# Patient Record
Sex: Male | Born: 1980 | Race: Black or African American | Hispanic: No | Smoking: Never smoker
Health system: Southern US, Community
[De-identification: ages and names within clinical notes are randomized; demographics above are authoritative.]

## PROBLEM LIST (undated history)

## (undated) HISTORY — PX: KNEE LIGAMENT RECONSTRUCTION: SHX1895

---

## 2015-02-09 ENCOUNTER — Encounter: Payer: Self-pay | Admitting: Family Medicine

## 2015-02-09 ENCOUNTER — Ambulatory Visit (INDEPENDENT_AMBULATORY_CARE_PROVIDER_SITE_OTHER): Payer: BLUE CROSS/BLUE SHIELD | Admitting: Family Medicine

## 2015-02-09 VITALS — BP 122/74 | HR 79 | Temp 98.1°F | Resp 18 | Ht 72.0 in | Wt 247.9 lb

## 2015-02-09 DIAGNOSIS — L84 Corns and callosities: Secondary | ICD-10-CM

## 2015-02-09 NOTE — Patient Instructions (Signed)
Corns and Calluses Corns are small areas of thickened skin that usually occur on the top, sides, or tip of a toe. They contain a cone-shaped core with a point that can press on a nerve below. This causes pain. Calluses are areas of thickened skin that usually develop on hands, fingers, palms, soles of the feet, and heels. These are areas that experience frequent friction or pressure. CAUSES  Corns are usually the result of rubbing (friction) or pressure from shoes that are too tight or do not fit properly. Calluses are caused by repeated friction and pressure on the affected areas. SYMPTOMS  A hard growth on the skin.  Pain or tenderness under the skin.  Sometimes, redness and swelling.  Increased discomfort while wearing tight-fitting shoes. DIAGNOSIS  Your caregiver can usually tell what the problem is by doing a physical exam. TREATMENT  Removing the cause of the friction or pressure is usually the only treatment needed. However, sometimes medicines can be used to help soften the hardened, thickened areas. These medicines include salicylic acid plasters and 12% ammonium lactate lotion. These medicines should only be used under the direction of your caregiver. HOME CARE INSTRUCTIONS   Try to remove pressure from the affected area.  You may wear donut-shaped corn pads to protect your skin.  You may use a pumice stone or nonmetallic nail file to gently reduce the thickness of a corn.  Wear properly fitted footwear.  If you have calluses on the hands, wear gloves during activities that cause friction.  If you have diabetes, you should regularly examine your feet. Tell your caregiver if you notice any problems with your feet. SEEK IMMEDIATE MEDICAL CARE IF:   You have increased pain, swelling, redness, or warmth in the affected area.  Your corn or callus starts to drain fluid or bleeds.  You are not getting better, even with treatment. Document Released: 04/14/2004 Document  Revised: 10/01/2011 Document Reviewed: 03/06/2011 Exit[employer] Patient Information 2015 [employer]. This information is not intended to replace advice given to you by your health care provider. Make sure you discuss any questions you have with your health care provider.  

## 2015-02-09 NOTE — Progress Notes (Signed)
Name: Jonathan Salas   MRN: [id number]    DOB: 1981-07-05   Date:02/09/2015       Progress Note  Subjective  Chief Complaint  Chief Complaint  Patient presents with  . Establish Care  . Callouses    right great toe, patient stated he has had it for about 4-5 months    HPI  Patient is new to practice and reports no significant medical issues. Today he would like to address his right foot 1st digit callus which is painful and irritating when wearing shoes. No bleeding. Has not tried OTC therapy.  History  Substance Use Topics  . Smoking status: Never Smoker   . Smokeless tobacco: Not on file  . Alcohol Use: No    No current outpatient prescriptions on file.  Past Surgical History  Procedure Laterality Date  . Knee ligament reconstruction Right     History reviewed. No pertinent family history.  No Known Allergies   Review of Systems  CONSTITUTIONAL: No significant weight changes, fever, chills, weakness or fatigue.  HEENT:  - Eyes: No visual changes.  - Ears: No auditory changes. No pain.  - Nose: No sneezing, congestion, runny nose. - Throat: No sore throat. No changes in swallowing. SKIN: No rash or itching.  CARDIOVASCULAR: No chest pain, chest pressure or chest discomfort. No palpitations or edema.  RESPIRATORY: No shortness of breath, cough or sputum.  GASTROINTESTINAL: No anorexia, nausea, vomiting. No changes in bowel habits. No abdominal pain or blood.  GENITOURINARY: No dysuria. No frequency. No discharge.  NEUROLOGICAL: No headache, dizziness, syncope, paralysis, ataxia, numbness or tingling in the extremities. No memory changes. No change in bowel or bladder control.  MUSCULOSKELETAL: No joint pain. No muscle pain. HEMATOLOGIC: No anemia, bleeding or bruising.  LYMPHATICS: No enlarged lymph nodes.  PSYCHIATRIC: No change in mood. No change in sleep pattern.  ENDOCRINOLOGIC: No reports of sweating, cold or heat intolerance. No polyuria or polydipsia.       Objective  BP 122/74 mmHg  Pulse 79  Temp(Src) 98.1 F (36.7 C) (Oral)  Resp 18  Ht 6' (1.829 m)  Wt 247 lb 14.4 oz (112.447 kg)  BMI 33.61 kg/m2  SpO2 98% Body mass index is 33.61 kg/(m^2).  Physical Exam  Constitutional: Patient appears well-developed and well-nourished. In no distress.  HEENT:  - Head: Normocephalic and atraumatic.  - Ears: Bilateral TMs gray, no erythema or effusion - Nose: Nasal mucosa moist - Mouth/Throat: Oropharynx is clear and moist. No tonsillar hypertrophy or erythema. No post nasal drainage.  - Eyes: Conjunctivae clear, EOM movements normal. PERRLA. No scleral icterus.  Neck: Normal range of motion. Neck supple. No JVD present. No thyromegaly present.  Cardiovascular: Normal rate, regular rhythm and normal heart sounds.  No murmur heard.  Pulmonary/Chest: Effort normal and breath sounds normal. No respiratory distress. Musculoskeletal: Normal range of motion bilateral UE and LE, no joint effusions. Peripheral vascular: Bilateral LE no edema. Neurological: CN II-XII grossly intact with no focal deficits. Alert and oriented to person, place, and time. Coordination, balance, strength, speech and gait are normal.  Skin: Skin is warm and dry. No rash noted. No erythema.   Painful thick callus on right foot medial aspect of 1st toe.  Psychiatric: Patient has a normal mood and affect. Behavior is normal in office today. Judgment and thought content normal in office today.   Assessment & Plan  1. Callus of foot  The patient complains of tenderness and irritation due to skin mass.  No fevers, chills, short of breath, headache, lethargy, change in mentation, chest pain or palpitations.   Duration of symptoms: years Indication for removal: irritation, tenderness Consent signed: YES  Procedure: Foot Callus Freezing Location: Right foot 1st digit medial aspect Equipment used: Liquid Nitroglycerine Spray Anesthesia: None Cleaned and prepped:  Alcohol swab  After consent signed, are of skin prepped with alcohol and area sprayed with Liquid Nitro solution. Instructed on proper care to allow for proper healing. F/U for nursing visit if needed.

## 2015-03-17 ENCOUNTER — Ambulatory Visit: Payer: BLUE CROSS/BLUE SHIELD | Admitting: Family Medicine

## 2015-04-19 ENCOUNTER — Ambulatory Visit: Payer: BLUE CROSS/BLUE SHIELD | Admitting: Family Medicine

## 2016-03-11 ENCOUNTER — Ambulatory Visit (INDEPENDENT_AMBULATORY_CARE_PROVIDER_SITE_OTHER): Payer: BLUE CROSS/BLUE SHIELD

## 2016-03-11 ENCOUNTER — Encounter (HOSPITAL_COMMUNITY): Payer: Self-pay | Admitting: Nurse Practitioner

## 2016-03-11 ENCOUNTER — Ambulatory Visit (HOSPITAL_COMMUNITY)
Admission: EM | Admit: 2016-03-11 | Discharge: 2016-03-11 | Disposition: A | Discharge status: Home / Self Care | Payer: BLUE CROSS/BLUE SHIELD | Attending: Internal Medicine | Admitting: Internal Medicine

## 2016-03-11 DIAGNOSIS — S92301A Fracture of unspecified metatarsal bone(s), right foot, initial encounter for closed fracture: Secondary | ICD-10-CM

## 2016-03-11 DIAGNOSIS — T1490XA Injury, unspecified, initial encounter: Secondary | ICD-10-CM

## 2016-03-11 MED ORDER — HYDROCODONE-ACETAMINOPHEN 10-325 MG PO TABS: 1.0000 | ORAL_TABLET | Freq: Four times a day (QID) | ORAL | 0 refills | Status: DC | PRN

## 2016-03-11 NOTE — Discharge Instructions (Signed)
REST ICE ELEVATE  FOLLOW UP WITH FOOT DOCTOR NO BASKETBALL

## 2016-03-11 NOTE — ED Provider Notes (Signed)
CSN: [id number]     Arrival date & time 03/11/16  1212 History   First MD Initiated Contact with Patient 03/11/16 1316     Chief Complaint  Patient presents with  . Ankle Pain   (Consider location/radiation/quality/duration/timing/severity/associated sxs/prior Treatment) HPI 35 year old male was playing basketball yesterday when he came down and rolled his foot and had immediate onset of pain in the right lateral foot. Home treatment has included elevation and cold compresses and Tylenol. Pain score is approximately 6 at this time. No associated symptoms. History reviewed. No pertinent past medical history. Past Surgical History:  Procedure Laterality Date  . KNEE LIGAMENT RECONSTRUCTION Right    History reviewed. No pertinent family history. Social History  Substance Use Topics  . Smoking status: Never Smoker  . Smokeless tobacco: Never Used  . Alcohol use No    Review of Systems  Denies: HEADACHE, NAUSEA, ABDOMINAL PAIN, CHEST PAIN, CONGESTION, DYSURIA, SHORTNESS OF BREATH  Allergies  Review of patient's allergies indicates no known allergies.  Home Medications   Prior to Admission medications   Medication Sig Start Date End Date Taking? Authorizing Provider  HYDROcodone-acetaminophen (NORCO) 10-325 MG tablet Take 1 tablet by mouth every 6 (six) hours as needed. 03/11/16   Tharon AquasFrank C Kennedy Brines, PA   Meds Ordered and Administered this Visit  Medications - No data to display  BP 107/76   Pulse 74   Temp 98.4 F (36.9 C) (Oral)   Resp 18   SpO2 100%  No data found.   Physical Exam NURSES NOTES AND VITAL SIGNS REVIEWED. CONSTITUTIONAL: Well developed, well nourished, no acute distress HEENT: normocephalic, atraumatic EYES: Conjunctiva normal NECK:normal ROM, supple, no adenopathy PULMONARY:No respiratory distress, normal effort ABDOMINAL: Soft, ND, NT BS+, No CVAT MUSCULOSKELETAL: Normal ROM of all extremities, right foot tenderness over the lateral aspect over the  fifth metatarsal. No visible or palpable deformity noted. No ankle tenderness. Proximal fibula tenderness. SKIN: warm and dry without rash PSYCHIATRIC: Mood and affect, behavior are normal  Urgent Care Course   Clinical Course    Procedures (including critical care time)  Labs Review Labs Reviewed - No data to display  Imaging Review Dg Foot Complete Right  Result Date: 03/11/2016 CLINICAL DATA:  Basketball injury with lateral pain. EXAM: RIGHT FOOT COMPLETE - 3+ VIEW COMPARISON:  None. FINDINGS: Avulsion fracture of the base of the fifth metatarsal. Otherwise normal exam. IMPRESSION: Avulsion fracture of the base of the fifth metatarsal. Electronically Signed   By: Paulina FusiMark  Shogry M.D.   On: 03/11/2016 14:05    X-ray results are discussed with patient and his wife prior to discharge. Visual Acuity Review  Right Eye Distance:   Left Eye Distance:   Bilateral Distance:    Right Eye Near:   Left Eye Near:    Bilateral Near:        35 year old male with a metatarsal fracture., Verdene Rioam Walker, for referral to podiatry, analgesia, work note provided MDM   1. Metatarsal fracture, right, closed, initial encounter   2. Injury     Patient is reassured that there are no issues that require transfer to higher level of care at this time or additional tests. Patient is advised to continue home symptomatic treatment. Patient is advised that if there are new or worsening symptoms to attend the emergency department, contact primary care provider, or return to UC. Instructions of care provided discharged home in stable condition.    THIS NOTE WAS GENERATED USING A VOICE RECOGNITION SOFTWARE PROGRAM. ALL  REASONABLE EFFORTS  WERE MADE TO PROOFREAD THIS DOCUMENT FOR ACCURACY.  I have verbally reviewed the discharge instructions with the patient. A printed AVS was given to the patient.  All questions were answered prior to discharge.      Tharon AquasFrank C Mana Haberl, PA 03/11/16 2051

## 2016-03-11 NOTE — ED Triage Notes (Signed)
Pt c/o R ankle pain and swelling onset while playing basketball yesterday. He has applied ace wrap, soaked in epsom salt at home with no relief. He Is ambulatory.

## 2016-03-12 ENCOUNTER — Ambulatory Visit (INDEPENDENT_AMBULATORY_CARE_PROVIDER_SITE_OTHER): Payer: BLUE CROSS/BLUE SHIELD | Admitting: Podiatry

## 2016-03-12 ENCOUNTER — Ambulatory Visit (INDEPENDENT_AMBULATORY_CARE_PROVIDER_SITE_OTHER): Payer: BLUE CROSS/BLUE SHIELD

## 2016-03-12 ENCOUNTER — Encounter: Payer: Self-pay | Admitting: Podiatry

## 2016-03-12 DIAGNOSIS — M79671 Pain in right foot: Secondary | ICD-10-CM

## 2016-03-12 DIAGNOSIS — S92301A Fracture of unspecified metatarsal bone(s), right foot, initial encounter for closed fracture: Secondary | ICD-10-CM

## 2016-03-12 NOTE — Progress Notes (Signed)
Subjective:     Patient ID: Jonathan Salas, male   DOB: 10/23/1980, 35 y.o.   MRN: [id number]  HPI patient states he broke his right foot over the weekend and he is worried that he still cannot walk on it   Review of Systems  All other systems reviewed and are negative.      Objective:   Physical Exam  Constitutional: He is oriented to person, place, and time.  Cardiovascular: Intact distal pulses.   Musculoskeletal: Normal range of motion.  Neurological: He is oriented to person, place, and time.  Skin: Skin is warm.  Nursing note and vitals reviewed.  neurovascular status intact muscle strength adequate range of motion within normal limits with patient found to have significant forefoot edema within the entire dorsal foot at extended to the lateral side of foot with exquisite discomfort around the base of the fifth metatarsal right. Patient cannot bear weight on the side of his foot due to the pain was found have good digital perfusion and is well oriented 3     Assessment:     Fracture base of fifth metatarsal right with comminuted element to it with swelling of the foot    Plan:     H&P and multiple view x-rays reviewed and today I applied Unna boot Ace wrap and instructed on limited on 4-5 days and less she should develop pain or discoloration of his nails or skin where he should take it off. I explained that this may need to be operated on eventually the fragments may need to be removed and the tendon tightened but at this point I would not do that and less symptoms persist and we will watch and see this patient again in about 2 weeks. Continue to use air fracture walking boot  X-ray report indicated that there is some fragments around the base of the fifth metatarsal right

## 2016-03-12 NOTE — Progress Notes (Signed)
   Subjective:    Patient ID: Jonathan Salas, male    DOB: 03/22/1981, 35 y.o.   MRN: [id number]  HPI  I broke my foot playing basketball on Saturday went to urgent care Sunday     Review of Systems  All other systems reviewed and are negative.      Objective:   Physical Exam        Assessment & Plan:

## 2016-03-16 ENCOUNTER — Telehealth: Payer: Self-pay | Admitting: *Deleted

## 2016-03-16 DIAGNOSIS — S92301A Fracture of unspecified metatarsal bone(s), right foot, initial encounter for closed fracture: Secondary | ICD-10-CM

## 2016-03-16 NOTE — Telephone Encounter (Addendum)
Pt left name and phone. Unable to leave a message mailbox is full. Pt came to office for paperwork for FMLA, front office staff helped.

## 2016-03-23 ENCOUNTER — Encounter: Payer: Self-pay | Admitting: Podiatry

## 2016-03-23 ENCOUNTER — Ambulatory Visit (INDEPENDENT_AMBULATORY_CARE_PROVIDER_SITE_OTHER): Payer: BLUE CROSS/BLUE SHIELD

## 2016-03-23 ENCOUNTER — Ambulatory Visit (INDEPENDENT_AMBULATORY_CARE_PROVIDER_SITE_OTHER): Payer: BLUE CROSS/BLUE SHIELD | Admitting: Podiatry

## 2016-03-23 DIAGNOSIS — M79671 Pain in right foot: Secondary | ICD-10-CM

## 2016-03-23 DIAGNOSIS — S92301A Fracture of unspecified metatarsal bone(s), right foot, initial encounter for closed fracture: Secondary | ICD-10-CM

## 2016-03-23 NOTE — Progress Notes (Signed)
Subjective:     Patient ID: Jonathan Salas, male   DOB: 06/24/1981, 35 y.o.   MRN: [id number]  HPI patient states the pain has improved but it still present and he still getting some swelling   Review of Systems     Objective:   Physical Exam Neurovascular status intact muscle strength adequate with patient noted to have discomfort in the lateral side of the right foot around the base of the fifth metatarsal with fluid buildup noted    Assessment:     What appears to be small fracture right which were hopeful for healing    Plan:     Dispensed a brace to start wearing and instructed on continued boot usage with gradual reduction over the next couple weeks. Reappoint 4 weeks or earlier if needed  X-rays indicate there still is a small fleck fracture on the lateral side of the right fifth metatarsal that is localized

## 2016-04-20 ENCOUNTER — Encounter: Payer: Self-pay | Admitting: Podiatry

## 2016-04-20 ENCOUNTER — Ambulatory Visit (INDEPENDENT_AMBULATORY_CARE_PROVIDER_SITE_OTHER): Payer: BLUE CROSS/BLUE SHIELD | Admitting: Podiatry

## 2016-04-20 ENCOUNTER — Ambulatory Visit (INDEPENDENT_AMBULATORY_CARE_PROVIDER_SITE_OTHER): Payer: BLUE CROSS/BLUE SHIELD

## 2016-04-20 DIAGNOSIS — S92301A Fracture of unspecified metatarsal bone(s), right foot, initial encounter for closed fracture: Secondary | ICD-10-CM | POA: Diagnosis not present

## 2016-04-20 DIAGNOSIS — M79671 Pain in right foot: Secondary | ICD-10-CM

## 2016-04-20 NOTE — Progress Notes (Signed)
Subjective:     Patient ID: Jonathan DowMarcus A Smay, male   DOB: 07/13/1981, 35 y.o.   MRN: [id number]  HPI patient presents stating it's improving but still mildly painful in the outside of the right foot   Review of Systems     Objective:   Physical Exam Neurovascular status intact with bone spur and fracture of the base of fifth metatarsal right that appears to be healing    Assessment:     Healing area base of fifth metatarsal from previous fracture    Plan:     X-ray taken reviewed and did explain that if this becomes chronic we may need to eventually excise bone  X-ray indicates he appears to be occurring but is not fully present

## 2016-04-24 ENCOUNTER — Telehealth: Payer: Self-pay | Admitting: *Deleted

## 2016-04-24 NOTE — Telephone Encounter (Addendum)
Pt left name and phone number. Pt states Dr. Charlsie Merlesegal released him to go back to work, and he would like to know what he can do to get a prescription for pain medication. 04/25/2016-Dr. Regal prescribed Vicodin 5/325mg  #30 one tablet every 6 hours prn foot.  I left message informing pt [facility] and reminded him he could not work on or drive while on this narcotic pain medication.

## 2016-04-25 MED ORDER — HYDROCODONE-ACETAMINOPHEN 5-325 MG PO TABS: 1.0000 | ORAL_TABLET | Freq: Four times a day (QID) | ORAL | 0 refills | Status: DC | PRN

## 2016-04-25 NOTE — Telephone Encounter (Signed)
vicodin 5/325  #30 

## 2016-07-14 ENCOUNTER — Encounter (HOSPITAL_COMMUNITY): Payer: Self-pay | Admitting: *Deleted

## 2016-07-14 ENCOUNTER — Ambulatory Visit (HOSPITAL_COMMUNITY)
Admission: EM | Admit: 2016-07-14 | Discharge: 2016-07-14 | Disposition: A | Discharge status: Home / Self Care | Payer: BLUE CROSS/BLUE SHIELD | Attending: Emergency Medicine | Admitting: Emergency Medicine

## 2016-07-14 DIAGNOSIS — R05 Cough: Secondary | ICD-10-CM | POA: Diagnosis present

## 2016-07-14 DIAGNOSIS — Z79899 Other long term (current) drug therapy: Secondary | ICD-10-CM | POA: Diagnosis not present

## 2016-07-14 DIAGNOSIS — J029 Acute pharyngitis, unspecified: Secondary | ICD-10-CM | POA: Diagnosis present

## 2016-07-14 DIAGNOSIS — J40 Bronchitis, not specified as acute or chronic: Secondary | ICD-10-CM | POA: Insufficient documentation

## 2016-07-14 LAB — POCT RAPID STREP A: STREPTOCOCCUS, GROUP A SCREEN (DIRECT): NEGATIVE

## 2016-07-14 MED ORDER — AMOXICILLIN 500 MG PO CAPS: 500.0000 mg | ORAL_CAPSULE | Freq: Three times a day (TID) | ORAL | 0 refills | Status: DC

## 2016-07-14 MED ORDER — PREDNISONE 50 MG PO TABS: ORAL_TABLET | ORAL | 0 refills | Status: DC

## 2016-07-14 NOTE — ED Triage Notes (Signed)
C/O nasal congestion, runny nose, sore throat, productive cough, feeling feverish x 2 days.  Has been taking Tyl and IBU ( last dose @ approx 0900).

## 2016-07-14 NOTE — ED Provider Notes (Signed)
[facility]    CSN: [id number] Arrival date & time: 07/14/16  1201     History   Chief Complaint Chief Complaint  Patient presents with  . Cough  . Sore Throat    HPI Fredna DowMarcus A Mcgreal is a 35 y.o. male.   HPI  He is a 35 year old man here for evaluation of cough and sore throat. Symptoms started 2 days ago with nasal congestion, rhinorrhea, sore throat, and cough. He also reports intermittent left-sided ear pressure. He reports subjective fevers and chills. He's been taking Tylenol and ibuprofen with good improvement. He does report occasional wheeze. Denies any shortness of breath. No nausea or vomiting.  History reviewed. No pertinent past medical history.  Patient Active Problem List   Diagnosis Date Noted  . Callus of foot 02/09/2015    Past Surgical History:  Procedure Laterality Date  . KNEE LIGAMENT RECONSTRUCTION Right        Home Medications    Prior to Admission medications   Medication Sig Start Date End Date Taking? Authorizing Provider  amoxicillin (AMOXIL) 500 MG capsule Take 1 capsule (500 mg total) by mouth 3 (three) times daily. 07/14/16   Charm RingsErin J Lindell Tussey, MD  predniSONE (DELTASONE) 50 MG tablet Take 1 pill daily for 5 days. 07/14/16   Charm RingsErin J Ramiro Pangilinan, MD    Family History History reviewed. No pertinent family history.  Social History Social History  Substance Use Topics  . Smoking status: Never Smoker  . Smokeless tobacco: Never Used  . Alcohol use No     Allergies   Patient has no known allergies.   Review of Systems Review of Systems As in history of present illness  Physical Exam Triage Vital Signs ED Triage Vitals  Enc Vitals Group     BP 07/14/16 1216 127/83     Pulse Rate 07/14/16 1216 101     Resp 07/14/16 1216 20     Temp 07/14/16 1216 98.5 F (36.9 C)     Temp Source 07/14/16 1216 Oral     SpO2 07/14/16 1216 100 %     Weight --      Height --      Head Circumference --      Peak Flow --      Pain Score  07/14/16 1217 7     Pain Loc --      Pain Edu? --      Excl. in GC? --    No data found.   Updated Vital Signs BP 127/83   Pulse 101   Temp 98.5 F (36.9 C) (Oral)   Resp 20   SpO2 100%   Visual Acuity Right Eye Distance:   Left Eye Distance:   Bilateral Distance:    Right Eye Near:   Left Eye Near:    Bilateral Near:     Physical Exam  Constitutional: He appears well-developed and well-nourished. No distress.  HENT:  Mouth/Throat: No oropharyngeal exudate.  TMs mildly retracted bilaterally. Nasal mucosa is erythematous and edematous with clear drainage present. Oropharynx with mild erythema.  Neck: Neck supple.  He does have a right postauricular lymph node.  Cardiovascular: Normal rate, regular rhythm and normal heart sounds.   No murmur heard. Pulmonary/Chest: Effort normal. No respiratory distress. He has no wheezes. He has no rales.  Diffusely coarse breath sounds  Lymphadenopathy:    He has no cervical adenopathy.     UC Treatments / Results  Labs (all labs ordered are listed, but  only abnormal results are displayed) Labs Reviewed  POCT RAPID STREP A    EKG  EKG Interpretation None       Radiology No results found.  Procedures Procedures (including critical care time)  Medications Ordered in UC Medications - No data to display   Initial Impression / Assessment and Plan / UC Course  I have reviewed the triage vital signs and the nursing notes.  Pertinent labs & imaging results that were available during my care of the patient were reviewed by me and considered in my medical decision making (see chart for details).  Clinical Course     Treatment for bronchitis with amoxicillin and prednisone. He states he has a nasal spray and inhaler left over from similar symptoms a year ago. Follow-up as needed.  Final Clinical Impressions(s) / UC Diagnoses   Final diagnoses:  Bronchitis    New Prescriptions Discharge Medication List as of  07/14/2016 12:31 PM    START taking these medications   Details  amoxicillin (AMOXIL) 500 MG capsule Take 1 capsule (500 mg total) by mouth 3 (three) times daily., Starting Sat 07/14/2016, Normal    predniSONE (DELTASONE) 50 MG tablet Take 1 pill daily for 5 days., Normal         Charm RingsErin J Aime Meloche, MD 07/14/16 1255

## 2016-07-14 NOTE — Discharge Instructions (Signed)
You have bronchitis. Take azithromycin and prednisone as prescribed. Use albuterol as needed for cough. Use the nasal spray to help with congestion/runny nose. You should see improvement in the next 3-5 days. If you develop fevers, difficulty breathing, or are just not getting better, please come back or go to the emergency room.

## 2016-07-17 LAB — CULTURE, GROUP A STREP (THRC)

## 2016-09-19 ENCOUNTER — Ambulatory Visit (HOSPITAL_COMMUNITY)
Admission: EM | Admit: 2016-09-19 | Discharge: 2016-09-19 | Disposition: A | Discharge status: Home / Self Care | Payer: BLUE CROSS/BLUE SHIELD | Attending: Family Medicine | Admitting: Family Medicine

## 2016-09-19 ENCOUNTER — Encounter (HOSPITAL_COMMUNITY): Payer: Self-pay | Admitting: *Deleted

## 2016-09-19 DIAGNOSIS — J069 Acute upper respiratory infection, unspecified: Secondary | ICD-10-CM | POA: Diagnosis not present

## 2016-09-19 DIAGNOSIS — B9789 Other viral agents as the cause of diseases classified elsewhere: Secondary | ICD-10-CM

## 2016-09-19 MED ORDER — BENZONATATE 100 MG PO CAPS: 100.0000 mg | ORAL_CAPSULE | Freq: Three times a day (TID) | ORAL | 0 refills | Status: DC

## 2016-09-19 NOTE — ED Provider Notes (Signed)
CSN: [id number]     Arrival date & time 09/19/16  1635 History   First MD Initiated Contact with Patient 09/19/16 1726     Chief Complaint  Patient presents with  . Headache   (Consider location/radiation/quality/duration/timing/severity/associated sxs/prior Treatment) 36 year old male presents to clinic with 3 day history of fever, headaches, body aches, muscle aches, congestion, and cough. He denies any abdominal pain, nausea, vomiting, or diarrhea. She further complains of fatigue, weakness, loss of appetite, however he has been drinking fluids. He does not smoke, has no history of lung disease such as asthma, emphysema, or COPD, works in a [employer] shop, and he denies having had the influenza vaccine this year.   The history is provided by the patient.  Headache    History reviewed. No pertinent past medical history. Past Surgical History:  Procedure Laterality Date  . KNEE LIGAMENT RECONSTRUCTION Right    History reviewed. No pertinent family history. Social History  Substance Use Topics  . Smoking status: Never Smoker  . Smokeless tobacco: Never Used  . Alcohol use No    Review of Systems  Reason unable to perform ROS: as covered in HPI.  Neurological: Positive for headaches.  All other systems reviewed and are negative.   Allergies  Patient has no known allergies.  Home Medications   Prior to Admission medications   Medication Sig Start Date End Date Taking? Authorizing Provider  benzonatate (TESSALON) 100 MG capsule Take 1 capsule (100 mg total) by mouth every 8 (eight) hours. 09/19/16   Dorena BodoLawrence Marieke Lubke, NP   Meds Ordered and Administered this Visit  Medications - No data to display  BP 138/86 (BP Location: Right Arm)   Pulse 107   Temp 101.5 F (38.6 C) (Oral)   Resp 20   SpO2 100%  No data found.   Physical Exam  Constitutional: He is oriented to person, place, and time. He appears well-developed and well-nourished. He appears ill. No distress.   HENT:  Head: Normocephalic and atraumatic.  Right Ear: Tympanic membrane and external ear normal.  Left Ear: Tympanic membrane and external ear normal.  Nose: Nose normal. Right sinus exhibits no maxillary sinus tenderness and no frontal sinus tenderness. Left sinus exhibits no maxillary sinus tenderness and no frontal sinus tenderness.  Mouth/Throat: Uvula is midline, oropharynx is clear and moist and mucous membranes are normal. No oropharyngeal exudate. Tonsils are 2+ on the right. Tonsils are 2+ on the left. No tonsillar exudate.  Eyes: Pupils are equal, round, and reactive to light.  Neck: Normal range of motion. Neck supple. No JVD present.  Cardiovascular: Normal rate and regular rhythm.   Pulmonary/Chest: Effort normal and breath sounds normal. No respiratory distress. He has no wheezes.  Abdominal: Soft. Bowel sounds are normal.  Lymphadenopathy:       Head (right side): No submental, no submandibular, no tonsillar and no preauricular adenopathy present.       Head (left side): No submental, no submandibular, no tonsillar and no preauricular adenopathy present.    He has no cervical adenopathy.  Neurological: He is alert and oriented to person, place, and time.  Skin: Skin is warm and dry. Capillary refill takes less than 2 seconds. No rash noted. He is not diaphoretic. No erythema.  Psychiatric: He has a normal mood and affect.  Nursing note and vitals reviewed.   Urgent Care Course     Procedures (including critical care time)  Labs Review Labs Reviewed - No data to display  Imaging Review  No results found.        MDM   1. Viral URI with cough    You most likely have a viral URI, I advise rest, plenty of fluids and management of symptoms with over the counter medicines. For symptoms you may take Tylenol as needed every 4-6 hours for body aches or fever, not to exceed 4,000 mg a day, Take mucinex or mucinex DM ever 12 hours with a full glass of water, you may use  an inhaled steroid such as Flonase, 2 sprays each nostril once a day for congestion, or an antihistamine such as Claritin or Zyrtec once a day. For cough, I have prescribed a medication called Tessalon. Take 1 tablet every 8 hours as needed for your cough. Should your symptoms worsen or fail to resolve, follow up with your primary care provider or return to clinic.       Dorena Bodo, NP 09/19/16 1754

## 2016-09-19 NOTE — Discharge Instructions (Signed)
You most likely have a viral URI, I advise rest, plenty of fluids and management of symptoms with over the counter medicines. For symptoms you may take Tylenol as needed every 4-6 hours for body aches or fever, not to exceed 4,000 mg a day, Take mucinex or mucinex DM ever 12 hours with a full glass of water, you may use an inhaled steroid such as Flonase, 2 sprays each nostril once a day for congestion, or an antihistamine such as Claritin or Zyrtec once a day. For cough, I have prescribed a medication called Tessalon. Take 1 tablet every 8 hours as needed for your cough. Should your symptoms worsen or fail to resolve, follow up with your primary care provider or return to clinic.  °

## 2016-09-19 NOTE — ED Triage Notes (Signed)
Headache   Cough   With   Blood  Tinged   Sputum           Fever     With  Body  Aches       Symptoms      Symptoms  X   3  Days

## 2016-09-29 IMAGING — DX DG FOOT COMPLETE 3+V*R*
3 series · 3 of 3 positions shown · non-contrast
Comparison: None.

CLINICAL DATA: Basketball injury with lateral pain.

EXAM:
RIGHT FOOT COMPLETE - 3+ VIEW

[foot ap]
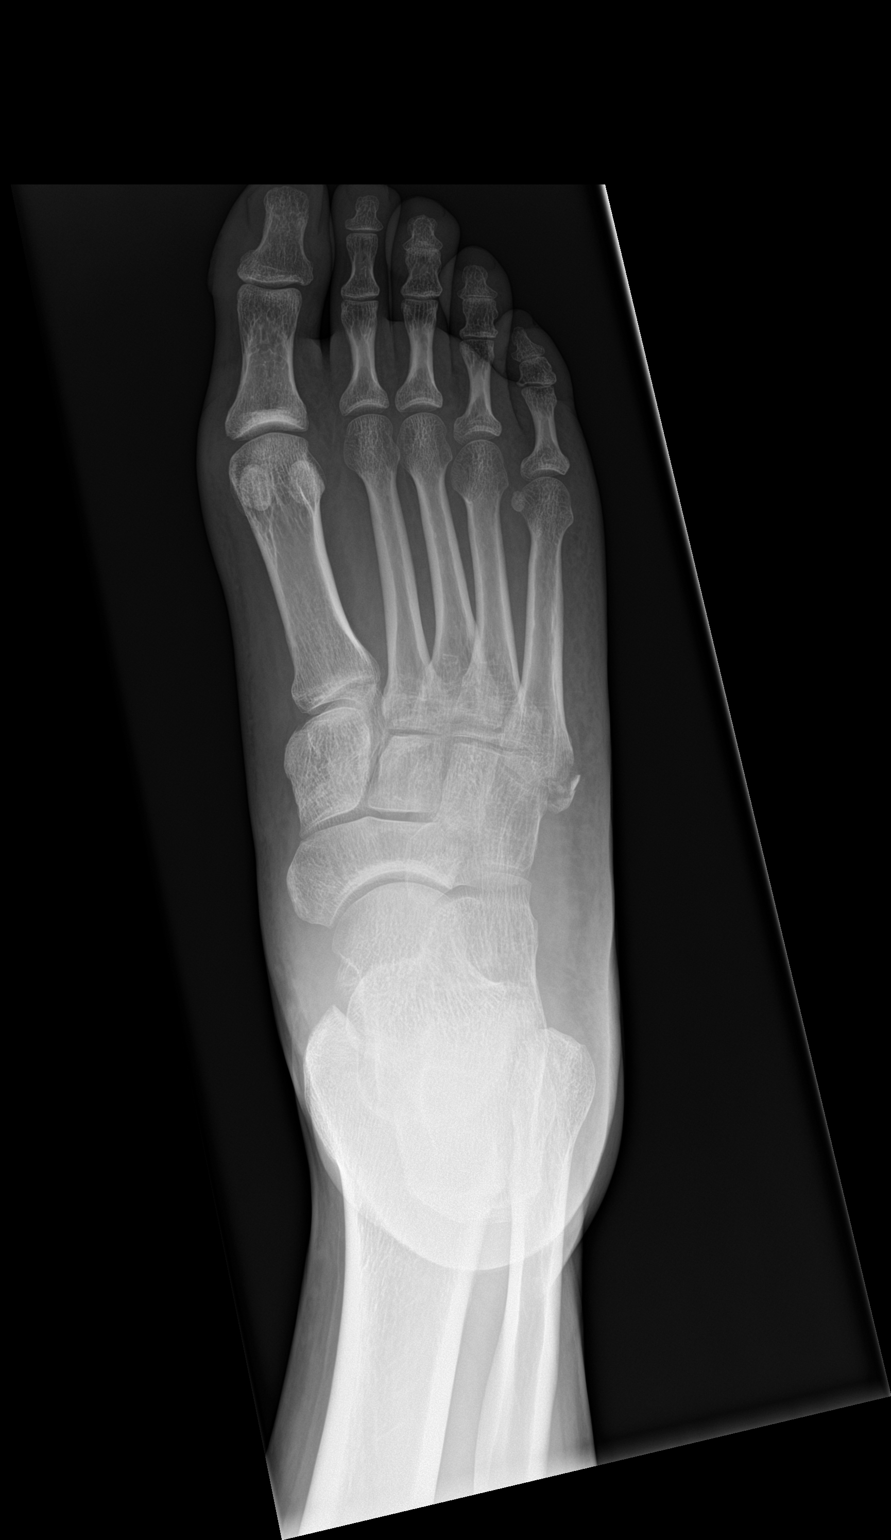

[foot obl]
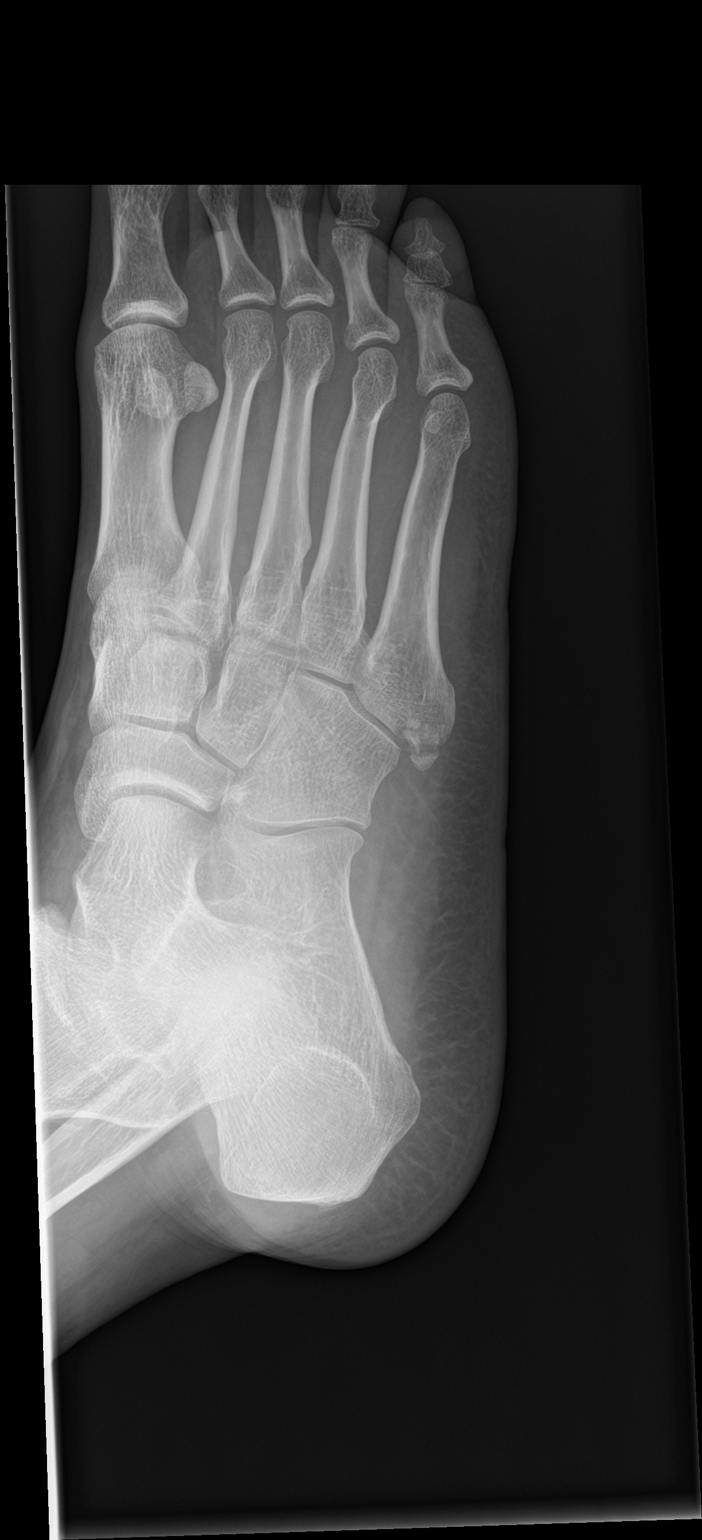

[foot lat]
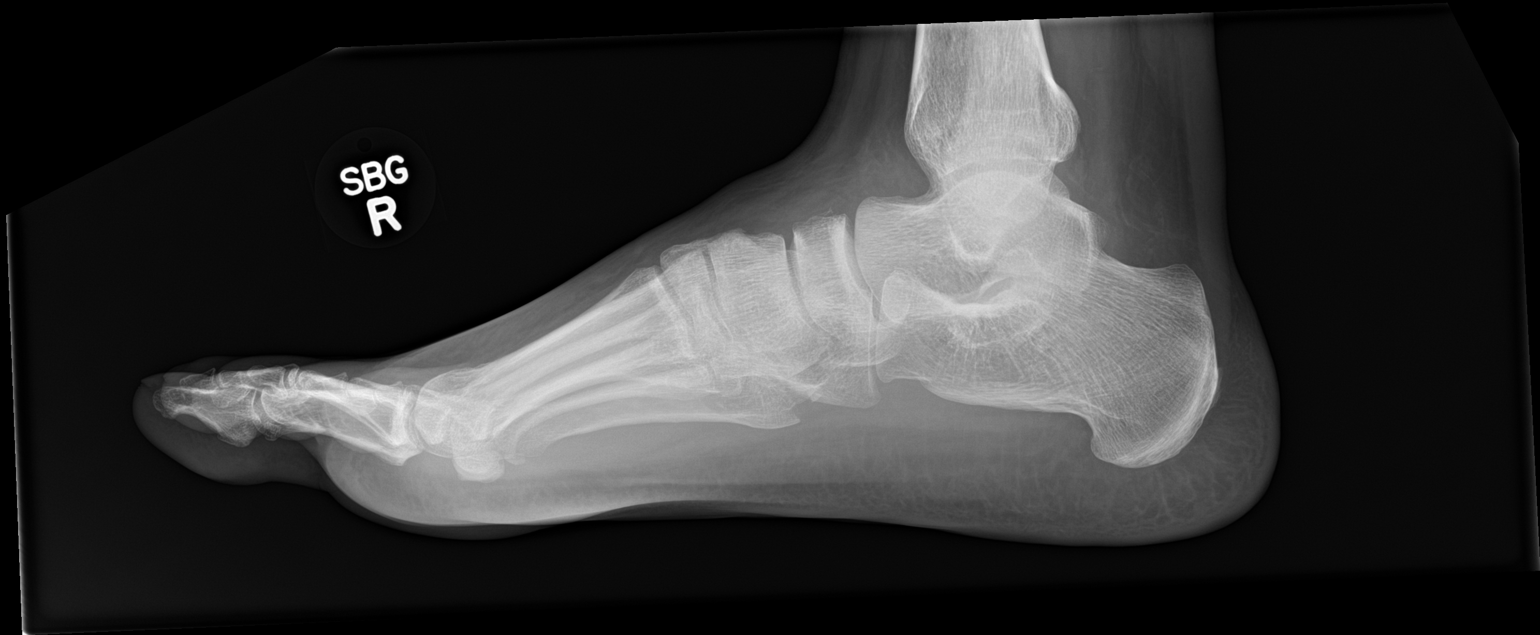

[3 of 3 positions shown; findings below may reference images not displayed]

FINDINGS: Avulsion fracture of the base of the fifth metatarsal. Otherwise
normal exam.
IMPRESSION: Avulsion fracture of the base of the fifth metatarsal.

## 2017-09-11 ENCOUNTER — Telehealth: Payer: Self-pay | Admitting: Podiatry

## 2017-09-11 NOTE — Telephone Encounter (Signed)
I was trying to reach someone in the records department. If you would please give me a call back at [phone].

## 2017-10-07 ENCOUNTER — Other Ambulatory Visit: Payer: Self-pay | Admitting: Obstetrics and Gynecology

## 2017-10-18 LAB — SEMEN ANALYSIS, BASIC
AGGLUTINATION: NONE SEEN
Appearance: NORMAL
Bacteria: NONE SEEN /hpf
Concentration, Sperm: 65.3 x10E6/mL (ref >14.9)
DEBRIS: NONE SEEN /HPF
Epithelial Cells: NONE SEEN /hpf
IMMOTILE SPERM: 51 %
Immature Germ Cell Conc.: 0.7 x10E6/mL
Leukocyte Concentration: 0 x10E6/mL (ref <1.0)
Liquefaction: NORMAL
NON-SPECIFIC AGGREGATION: NONE SEEN
NONPROGRESSIVE SPERM: 8.2 x10E6
NORMAL MORPHOLOGY-STRICT: 38 % (ref >3)
Non-Progressive (NP): 9 %
PH: 8.5 (ref >7.1)
Progressive Motility (PR): 40 % (ref >31)
Progressively Motile Sperm: 36.6 x10E6
ROUND CELL CONCENTRATION: 0.7 x10E6/mL
TOTAL MOTILE SPERM: 45.2 x10E6
TRICHOMONAS: NONE SEEN /HPF
Time Collected: 930
Time Received: 1000
Time Since Last Emission: 48 hours
Total Motility (PR+NP): 49 % (ref >39)
Total Sperm in Ejaculate: 91.4 x10E6 (ref >38.9)
VOLUME: 1.4 mL — AB (ref >1.4)

## 2019-08-11 ENCOUNTER — Ambulatory Visit (HOSPITAL_COMMUNITY)
Admission: EM | Admit: 2019-08-11 | Discharge: 2019-08-11 | Disposition: A | Discharge status: Home / Self Care | Payer: 59 | Attending: Family Medicine | Admitting: Family Medicine

## 2019-08-11 ENCOUNTER — Encounter (HOSPITAL_COMMUNITY): Payer: Self-pay | Admitting: Emergency Medicine

## 2019-08-11 ENCOUNTER — Other Ambulatory Visit: Payer: Self-pay

## 2019-08-11 DIAGNOSIS — N451 Epididymitis: Secondary | ICD-10-CM | POA: Diagnosis not present

## 2019-08-11 MED ORDER — DOXYCYCLINE HYCLATE 100 MG PO CAPS
100.0000 mg | ORAL_CAPSULE | Freq: Two times a day (BID) | ORAL | 0 refills | Status: DC
Start: 2019-08-11 — End: 2019-10-22

## 2019-08-11 MED ORDER — MELOXICAM 7.5 MG PO TABS: 7.5000 mg | ORAL_TABLET | Freq: Every day | ORAL | 0 refills | Status: DC

## 2019-08-11 NOTE — ED Provider Notes (Signed)
[facility]    CSN: [id number] Arrival date & time: 08/11/19  1815      History   Chief Complaint Chief Complaint  Patient presents with  . Abdominal Pain    HPI Jonathan Salas is a 39 y.o. male.   Complains of right lower quadrant abdominal pain with radiation down to the right testicle.  He rates the pain as a 3 or 4.  He denies any change in bowel habits.  He denies any urinary symptoms.  There is no history of STD.  HPI  History reviewed. No pertinent past medical history.  Patient Active Problem List   Diagnosis Date Noted  . Callus of foot 02/09/2015    Past Surgical History:  Procedure Laterality Date  . KNEE LIGAMENT RECONSTRUCTION Right        Home Medications    Prior to Admission medications   Medication Sig Start Date End Date Taking? Authorizing Provider  benzonatate (TESSALON) 100 MG capsule Take 1 capsule (100 mg total) by mouth every 8 (eight) hours. 09/19/16   Dorena Bodo, NP    Family History History reviewed. No pertinent family history.  Social History Social History   Tobacco Use  . Smoking status: Never Smoker  . Smokeless tobacco: Never Used  Substance Use Topics  . Alcohol use: No  . Drug use: No     Allergies   Patient has no known allergies.   Review of Systems Review of Systems  Gastrointestinal: Positive for abdominal pain.  All other systems reviewed and are negative.    Physical Exam Triage Vital Signs ED Triage Vitals  Enc Vitals Group     BP 08/11/19 1916 (!) 135/98     Pulse Rate 08/11/19 1916 79     Resp 08/11/19 1916 (!) 22     Temp 08/11/19 1916 98.3 F (36.8 C)     Temp Source 08/11/19 1916 Oral     SpO2 08/11/19 1916 99 %     Weight --      Height --      Head Circumference --      Peak Flow --      Pain Score 08/11/19 1914 5     Pain Loc --      Pain Edu? --      Excl. in [place]? --    No data found.  Updated Vital Signs BP 124/85 (BP Location: Right Arm) Comment (BP  Location): large cuff, repositioned  Pulse 79   Temp 98.3 F (36.8 C) (Oral)   Resp (!) 22   SpO2 99%   Visual Acuity Right Eye Distance:   Left Eye Distance:   Bilateral Distance:    Right Eye Near:   Left Eye Near:    Bilateral Near:     Physical Exam Vitals and nursing note reviewed.  Constitutional:      Appearance: He is well-developed and normal weight.  Abdominal:     General: Abdomen is flat. Bowel sounds are normal.     Palpations: Abdomen is soft.     Hernia: No hernia is present.  Genitourinary:    Testes:        Right: Tenderness present.     Comments: There is tenderness with palpation of the right epididymis Neurological:     Mental Status: He is alert.      UC Treatments / Results  Labs (all labs ordered are listed, but only abnormal results are displayed) Labs Reviewed - No data to  display  EKG   Radiology No results found.  Procedures Procedures (including critical care time)  Medications Ordered in UC Medications - No data to display  Initial Impression / Assessment and Plan / UC Course  I have reviewed the triage vital signs and the nursing notes.  Pertinent labs & imaging results that were available during my care of the patient were reviewed by me and considered in my medical decision making (see chart for details).     Testicular pain consistent with epididymitis Final Clinical Impressions(s) / UC Diagnoses   Final diagnoses:  None   Discharge Instructions   None    ED Prescriptions    None     PDMP not reviewed this encounter.   Wardell Honour, MD 08/11/19 [phone]

## 2019-08-11 NOTE — ED Triage Notes (Signed)
Lower abdominal pain started 3-4 days ago.  Denies pain with urination

## 2019-10-22 ENCOUNTER — Encounter (HOSPITAL_COMMUNITY): Payer: Self-pay

## 2019-10-22 ENCOUNTER — Other Ambulatory Visit: Payer: Self-pay

## 2019-10-22 ENCOUNTER — Ambulatory Visit (HOSPITAL_COMMUNITY)
Admission: EM | Admit: 2019-10-22 | Discharge: 2019-10-22 | Disposition: A | Discharge status: Home / Self Care | Payer: 59 | Attending: Family Medicine | Admitting: Family Medicine

## 2019-10-22 DIAGNOSIS — L723 Sebaceous cyst: Secondary | ICD-10-CM

## 2019-10-22 DIAGNOSIS — R1031 Right lower quadrant pain: Secondary | ICD-10-CM | POA: Diagnosis not present

## 2019-10-22 MED ORDER — DOXYCYCLINE HYCLATE 100 MG PO CAPS: 100.0000 mg | ORAL_CAPSULE | Freq: Two times a day (BID) | ORAL | 0 refills | Status: DC

## 2019-10-22 NOTE — ED Triage Notes (Signed)
Pt state he has a lower abdominal pain around his groin area x 3 days. Pt thinks he may have picked up something at work that may have caused this pain.

## 2019-10-22 NOTE — ED Provider Notes (Signed)
[place]    CSN: [id number] Arrival date & time: 10/22/19  1559      History   Chief Complaint Chief Complaint  Patient presents with  . Groin Pain    HPI Jonathan Salas is a 39 y.o. male.   Established [facility] patient, seen three months ago for epididymitis.  Pt state he has a lower abdominal pain around his groin area x 3 days. Pt thinks he may have picked up something at work that may have caused this pain.  Patient definitely noticed an improvement with the doxycycline given to him last January for epididymitis.  This pain is similar but not as bad.  It seems to be gradually getting worse over the last month.  No dysuria or hematuria.  Has had no fever.  He has had no penile discharge.  Patient also has a cyst on the right side of his lower neck which has been getting bigger over the last 6 months.     History reviewed. No pertinent past medical history.  Patient Active Problem List   Diagnosis Date Noted  . Callus of foot 02/09/2015    Past Surgical History:  Procedure Laterality Date  . KNEE LIGAMENT RECONSTRUCTION Right        Home Medications    Prior to Admission medications   Medication Sig Start Date End Date Taking? Authorizing Provider  doxycycline (VIBRAMYCIN) 100 MG capsule Take 1 capsule (100 mg total) by mouth 2 (two) times daily. 10/22/19   Robyn Haber, MD    Family History History reviewed. No pertinent family history.  Social History Social History   Tobacco Use  . Smoking status: Never Smoker  . Smokeless tobacco: Never Used  Substance Use Topics  . Alcohol use: No  . Drug use: No     Allergies   Patient has no known allergies.   Review of Systems Review of Systems  Constitutional: Negative.   Genitourinary: Negative.      Physical Exam Triage Vital Signs ED Triage Vitals  Enc Vitals Group     BP 10/22/19 1621 138/85     Pulse Rate 10/22/19 1621 88     Resp 10/22/19 1621 18     Temp 10/22/19 1621  98.2 F (36.8 C)     Temp Source 10/22/19 1621 Oral     SpO2 10/22/19 1621 98 %     Weight 10/22/19 1618 240 lb (108.9 kg)     Height --      Head Circumference --      Peak Flow --      Pain Score 10/22/19 1618 8     Pain Loc --      Pain Edu? --      Excl. in [place]? --    No data found.  Updated Vital Signs BP 138/85 (BP Location: Right Arm)   Pulse 88   Temp 98.2 F (36.8 C) (Oral)   Resp 18   Wt 108.9 kg   SpO2 98%   BMI 32.55 kg/m    Physical Exam Vitals and nursing note reviewed.  Constitutional:      Appearance: Normal appearance. He is normal weight.  HENT:     Head: Normocephalic.     Nose: Nose normal.     Mouth/Throat:     Mouth: Mucous membranes are moist.  Eyes:     Conjunctiva/sclera: Conjunctivae normal.  Neck:     Comments: 1 cm intradermal cyst at the base of the right lateral  neck Cardiovascular:     Rate and Rhythm: Normal rate.  Pulmonary:     Effort: Pulmonary effort is normal.  Abdominal:     General: Abdomen is flat.     Palpations: There is no mass.     Tenderness: There is no abdominal tenderness.  Genitourinary:    Penis: Normal.      Testes: Normal.     Comments: Circumcised male, in normal scrotum Musculoskeletal:        General: Normal range of motion.     Cervical back: Normal range of motion and neck supple.  Skin:    General: Skin is warm and dry.  Neurological:     General: No focal deficit present.     Mental Status: He is alert and oriented to person, place, and time.  Psychiatric:        Mood and Affect: Mood normal.        Behavior: Behavior normal.      UC Treatments / Results  Labs (all labs ordered are listed, but only abnormal results are displayed) Labs Reviewed - No data to display  EKG   Radiology No results found.  Procedures Procedures (including critical care time)  Medications Ordered in UC Medications - No data to display  Initial Impression / Assessment and Plan / UC Course  I have  reviewed the triage vital signs and the nursing notes.  Pertinent labs & imaging results that were available during my care of the patient were reviewed by me and considered in my medical decision making (see chart for details).    Final Clinical Impressions(s) / UC Diagnoses   Final diagnoses:  Right groin pain  Sebaceous cyst     Discharge Instructions     The lower abdominal and inguinal pain that is bothering you is difficult to diagnose based on the physical exam.  It may be that you have a mild epididymitis and we are giving you 10 days of doxycycline to see if this takes care of the problem.  If you are not improving with regard to the inguinal discomfort, please contact the urologist noted below.  The intradermal cyst is best handled by your primary care doctor and should be attended to in the next year.    ED Prescriptions    Medication Sig Dispense Auth. Provider   doxycycline (VIBRAMYCIN) 100 MG capsule Take 1 capsule (100 mg total) by mouth 2 (two) times daily. 20 capsule Elvina Sidle, MD     I have reviewed the PDMP during this encounter.   Elvina Sidle, MD 10/22/19 1642

## 2019-10-22 NOTE — Discharge Instructions (Addendum)
The lower abdominal and inguinal pain that is bothering you is difficult to diagnose based on the physical exam.  It may be that you have a mild epididymitis and we are giving you 10 days of doxycycline to see if this takes care of the problem.  If you are not improving with regard to the inguinal discomfort, please contact the urologist noted below.  The intradermal cyst is best handled by your primary care doctor and should be attended to in the next year.

## 2019-10-29 ENCOUNTER — Other Ambulatory Visit: Payer: Self-pay | Admitting: Family Medicine

## 2019-10-29 DIAGNOSIS — L729 Follicular cyst of the skin and subcutaneous tissue, unspecified: Secondary | ICD-10-CM

## 2019-11-04 ENCOUNTER — Ambulatory Visit: Payer: 59 | Attending: Family Medicine

## 2020-02-02 ENCOUNTER — Other Ambulatory Visit: Payer: Self-pay

## 2020-02-02 ENCOUNTER — Ambulatory Visit
Admission: RE | Admit: 2020-02-02 | Discharge: 2020-02-02 | Disposition: A | Discharge status: Home / Self Care | Payer: 59 | Source: Ambulatory Visit | Attending: Family Medicine | Admitting: Family Medicine

## 2020-02-02 DIAGNOSIS — L729 Follicular cyst of the skin and subcutaneous tissue, unspecified: Secondary | ICD-10-CM

## 2020-06-06 ENCOUNTER — Ambulatory Visit (INDEPENDENT_AMBULATORY_CARE_PROVIDER_SITE_OTHER): Payer: 59 | Admitting: Otolaryngology

## 2020-06-06 ENCOUNTER — Other Ambulatory Visit: Payer: Self-pay

## 2020-06-06 ENCOUNTER — Encounter (INDEPENDENT_AMBULATORY_CARE_PROVIDER_SITE_OTHER): Payer: Self-pay | Admitting: Otolaryngology

## 2020-06-06 VITALS — Temp 96.3°F

## 2020-06-06 DIAGNOSIS — L720 Epidermal cyst: Secondary | ICD-10-CM

## 2020-06-06 NOTE — Progress Notes (Signed)
HPI: Jonathan Salas is a 39 y.o. male who presents for evaluation of a right neck cyst that he has had for the past year to year and a half.  It has gradually got little bit larger and protrudes.  He is otherwise healthy. He is on no medications. NKDA  No past medical history on file. Past Surgical History:  Procedure Laterality Date  . KNEE LIGAMENT RECONSTRUCTION Right    Social History   Socioeconomic History  . Marital status: Significant Other    Spouse name: Not on file  . Number of children: 1  . Years of education: Not on file  . Highest education level: Not on file  Occupational History  . Occupation: Location manager  Tobacco Use  . Smoking status: Never Smoker  . Smokeless tobacco: Never Used  Substance and Sexual Activity  . Alcohol use: No  . Drug use: No  . Sexual activity: Not on file  Other Topics Concern  . Not on file  Social History Narrative  . Not on file   Social Determinants of Health   Financial Resource Strain:   . Difficulty of Paying Living Expenses: Not on file  Food Insecurity:   . Worried About Programme researcher, broadcasting/film/video in the Last Year: Not on file  . Ran Out of Food in the Last Year: Not on file  Transportation Needs:   . Lack of Transportation (Medical): Not on file  . Lack of Transportation (Non-Medical): Not on file  Physical Activity:   . Days of Exercise per Week: Not on file  . Minutes of Exercise per Session: Not on file  Stress:   . Feeling of Stress : Not on file  Social Connections:   . Frequency of Communication with Friends and Family: Not on file  . Frequency of Social Gatherings with Friends and Family: Not on file  . Attends Religious Services: Not on file  . Active Member of Clubs or Organizations: Not on file  . Attends Banker Meetings: Not on file  . Marital Status: Not on file   No family history on file. No Known Allergies Prior to Admission medications   Medication Sig Start Date End Date Taking?  Authorizing Provider  doxycycline (VIBRAMYCIN) 100 MG capsule Take 1 capsule (100 mg total) by mouth 2 (two) times daily. Patient not taking: Reported on 06/06/2020 10/22/19   Elvina Sidle, MD     Positive ROS: Otherwise negative  All other systems have been reviewed and were otherwise negative with the exception of those mentioned in the HPI and as above.  Physical Exam: Constitutional: Alert, well-appearing, no acute distress Ears: External ears without lesions or tenderness. Ear canals are clear bilaterally with intact, clear TMs.  Nasal: External nose without lesions.. Clear nasal passages Oral: Lips and gums without lesions. Tongue and palate mucosa without lesions. Posterior oropharynx clear. Neck: No palpable adenopathy or masses.  Patient has an approximate 2 cm right lower neck subcutaneous cyst that is not infected and easily mobile. Respiratory: Breathing comfortably  Skin: No facial/neck lesions or rash noted.  Procedures  Assessment: Right neck epidermal cyst 2 cm  Plan: Discussed excision of this under local anesthetic and he will talk to our nurse concerning scheduling this.  Narda Bonds, MD

## 2020-06-23 DIAGNOSIS — L720 Epidermal cyst: Secondary | ICD-10-CM | POA: Diagnosis not present

## 2020-06-28 ENCOUNTER — Other Ambulatory Visit: Payer: Self-pay

## 2020-06-28 ENCOUNTER — Ambulatory Visit (INDEPENDENT_AMBULATORY_CARE_PROVIDER_SITE_OTHER): Payer: 59 | Admitting: Otolaryngology

## 2020-06-28 VITALS — Temp 97.3°F

## 2020-06-28 DIAGNOSIS — Z4889 Encounter for other specified surgical aftercare: Secondary | ICD-10-CM

## 2020-06-28 NOTE — Progress Notes (Signed)
HPI: Jonathan Salas is a 39 y.o. male who presents 5 days s/p excision of right neck infected sebaceous cyst.  He is taking antibiotics Keflex 500 mg 3 times daily.  No past medical history on file. Past Surgical History:  Procedure Laterality Date  . KNEE LIGAMENT RECONSTRUCTION Right    Social History   Socioeconomic History  . Marital status: Significant Other    Spouse name: Not on file  . Number of children: 1  . Years of education: Not on file  . Highest education level: Not on file  Occupational History  . Occupation: Location manager  Tobacco Use  . Smoking status: Never Smoker  . Smokeless tobacco: Never Used  Substance and Sexual Activity  . Alcohol use: No  . Drug use: No  . Sexual activity: Not on file  Other Topics Concern  . Not on file  Social History Narrative  . Not on file   Social Determinants of Health   Financial Resource Strain:   . Difficulty of Paying Living Expenses: Not on file  Food Insecurity:   . Worried About Programme researcher, broadcasting/film/video in the Last Year: Not on file  . Ran Out of Food in the Last Year: Not on file  Transportation Needs:   . Lack of Transportation (Medical): Not on file  . Lack of Transportation (Non-Medical): Not on file  Physical Activity:   . Days of Exercise per Week: Not on file  . Minutes of Exercise per Session: Not on file  Stress:   . Feeling of Stress : Not on file  Social Connections:   . Frequency of Communication with Friends and Family: Not on file  . Frequency of Social Gatherings with Friends and Family: Not on file  . Attends Religious Services: Not on file  . Active Member of Clubs or Organizations: Not on file  . Attends Banker Meetings: Not on file  . Marital Status: Not on file   No family history on file. No Known Allergies Prior to Admission medications   Medication Sig Start Date End Date Taking? Authorizing Provider  doxycycline (VIBRAMYCIN) 100 MG capsule Take 1 capsule (100 mg  total) by mouth 2 (two) times daily. 10/22/19  Yes Elvina Sidle, MD     Physical Exam: He has a persistent serous effusion but no purulent discharge.  No signs of abscess or cellulitis   Assessment: S/p excision of right neck infected sebaceous cyst.  Plan: He will continue with antibiotics and have him follow-up in 2 days to have the sutures removed.   Narda Bonds, MD

## 2020-06-30 ENCOUNTER — Other Ambulatory Visit: Payer: Self-pay

## 2020-06-30 ENCOUNTER — Ambulatory Visit (INDEPENDENT_AMBULATORY_CARE_PROVIDER_SITE_OTHER): Payer: 59 | Admitting: Otolaryngology

## 2020-06-30 VITALS — Temp 98.1°F

## 2020-06-30 DIAGNOSIS — Z4889 Encounter for other specified surgical aftercare: Secondary | ICD-10-CM

## 2020-06-30 NOTE — Progress Notes (Signed)
HPI: Jonathan Salas is a 39 y.o. male who presents 1 week days s/p excision of right neck infected sebaceous cyst..   No past medical history on file. Past Surgical History:  Procedure Laterality Date  . KNEE LIGAMENT RECONSTRUCTION Right    Social History   Socioeconomic History  . Marital status: Significant Other    Spouse name: Not on file  . Number of children: 1  . Years of education: Not on file  . Highest education level: Not on file  Occupational History  . Occupation: Location manager  Tobacco Use  . Smoking status: Never Smoker  . Smokeless tobacco: Never Used  Substance and Sexual Activity  . Alcohol use: No  . Drug use: No  . Sexual activity: Not on file  Other Topics Concern  . Not on file  Social History Narrative  . Not on file   Social Determinants of Health   Financial Resource Strain: Not on file  Food Insecurity: Not on file  Transportation Needs: Not on file  Physical Activity: Not on file  Stress: Not on file  Social Connections: Not on file   No family history on file. No Known Allergies Prior to Admission medications   Medication Sig Start Date End Date Taking? Authorizing Provider  doxycycline (VIBRAMYCIN) 100 MG capsule Take 1 capsule (100 mg total) by mouth 2 (two) times daily. 10/22/19   Elvina Sidle, MD     Physical Exam: He has minimal swelling slight drainage.  He still on antibiotics for another 2 or 3 days.  Sutures were removed today and Steri-Strips were placed.  He will notify us if he has any increasing swelling or increasing drainage.   Assessment: S/p excision of infected right neck sebaceous cyst  Plan: He will follow-up as needed   Narda Bonds, MD

## 2020-08-22 IMAGING — US US SOFT TISSUE HEAD/NECK
1 series · 14 of 22 positions shown · non-contrast
Comparison: None.

CLINICAL DATA: Skin cyst for 1 year.

EXAM:
ULTRASOUND OF HEAD/NECK SOFT TISSUES
TECHNIQUE: Ultrasound examination of the head and neck soft tissues was
performed in the area of clinical concern.

[Series 1: us soft tissue head & neck (non-thyroid) · 22 acquisitions, 14 frames shown]
[im 1/22]
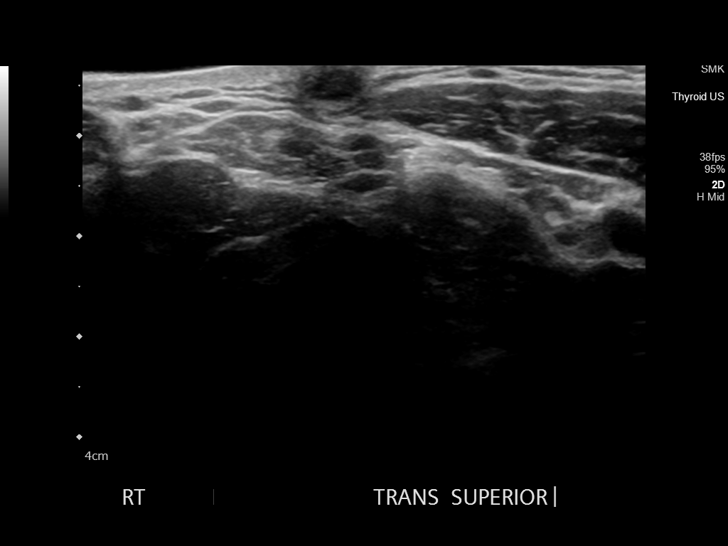
[im 3/22]
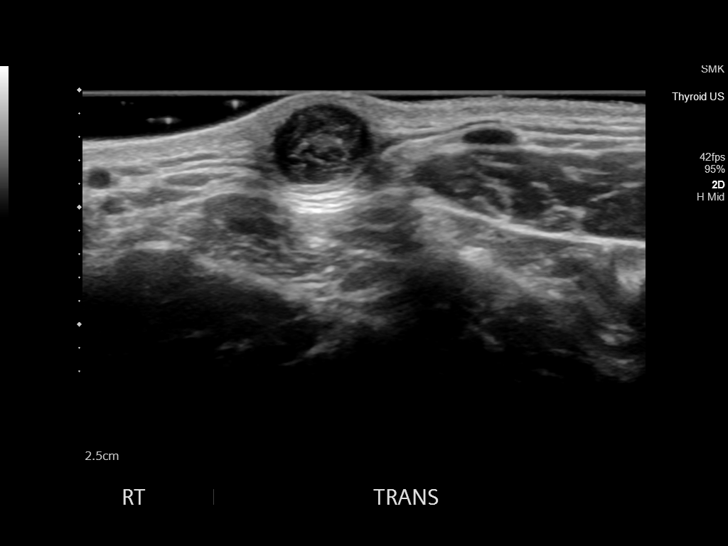
[im 4/22]
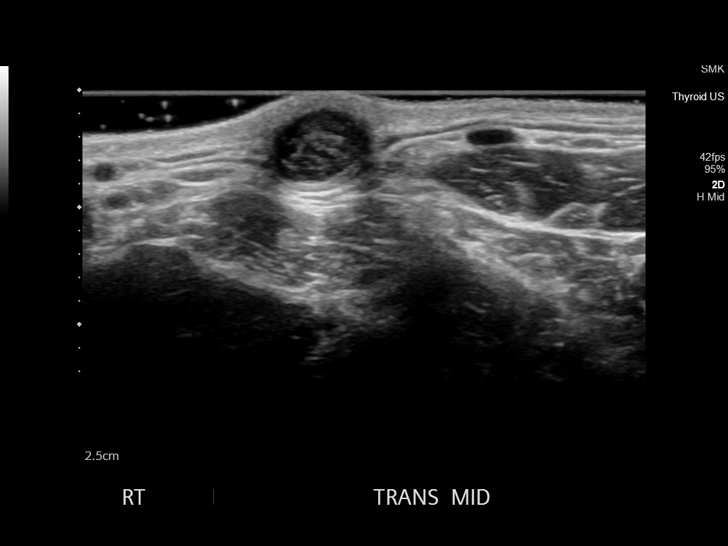
[im 6/22]
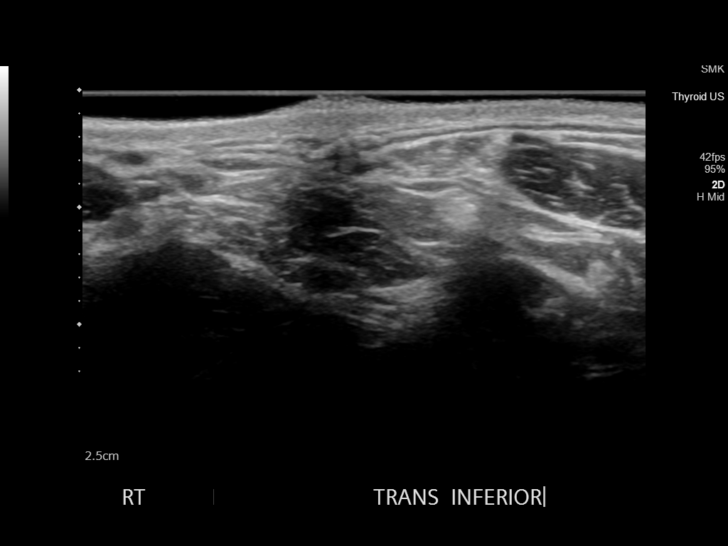
[im 8/22]
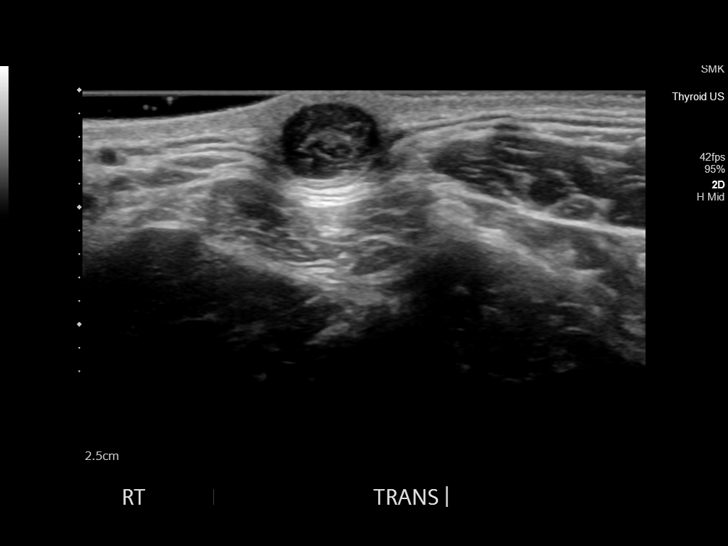
[im 9/22]
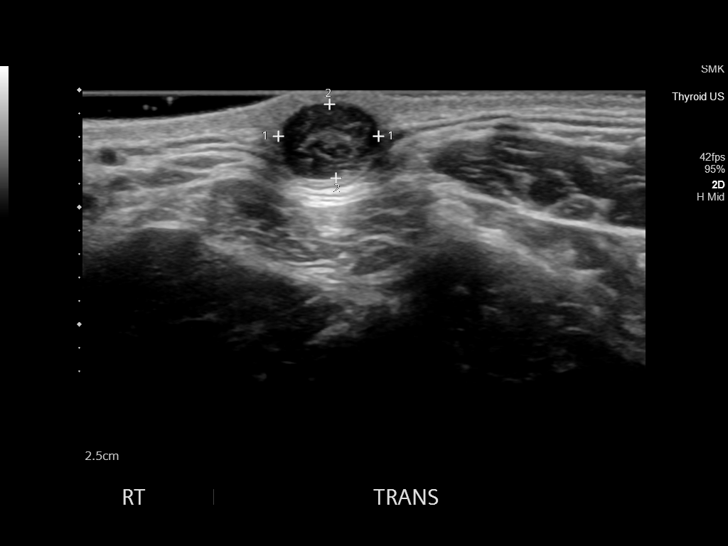
[im 11/22]
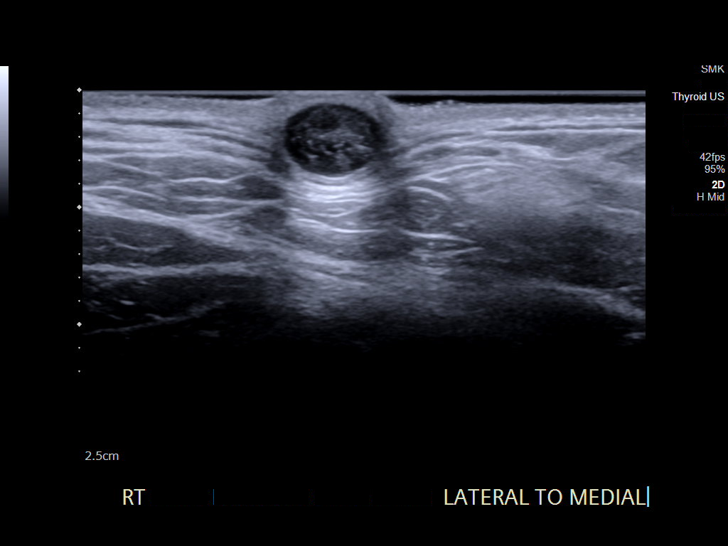
[im 12/22]
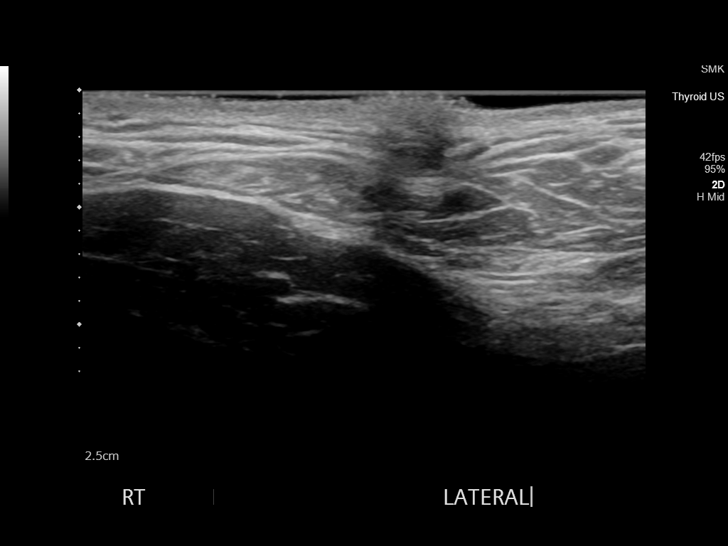
[im 14/22]
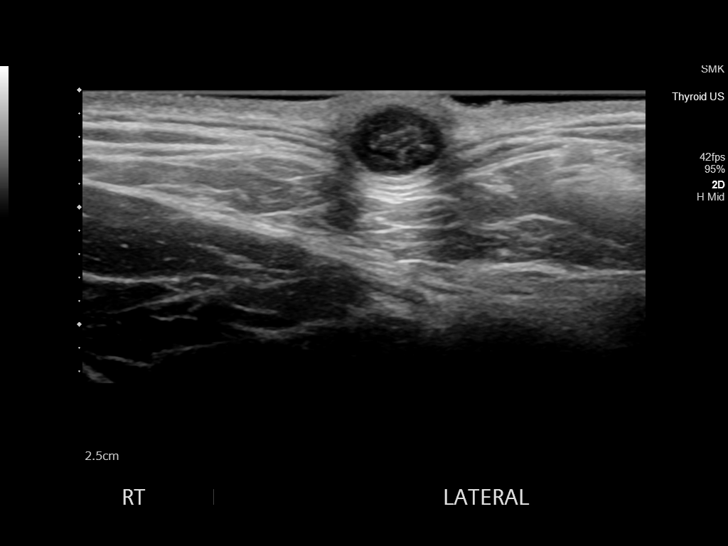
[im 15/22]
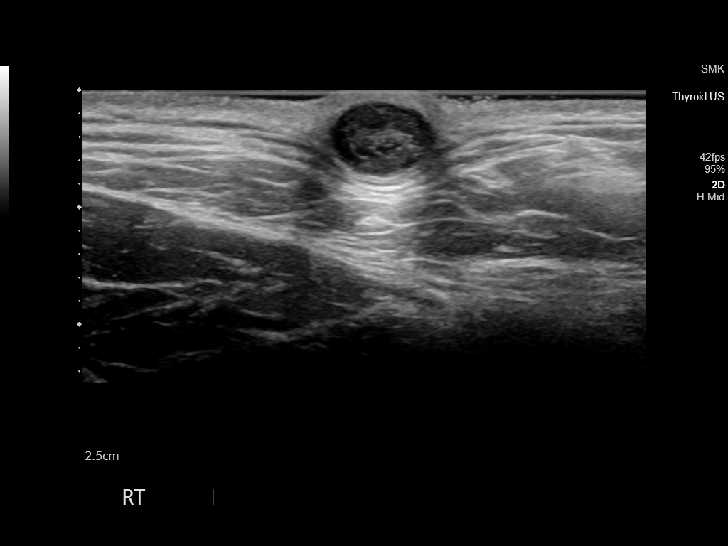
[im 17/22]
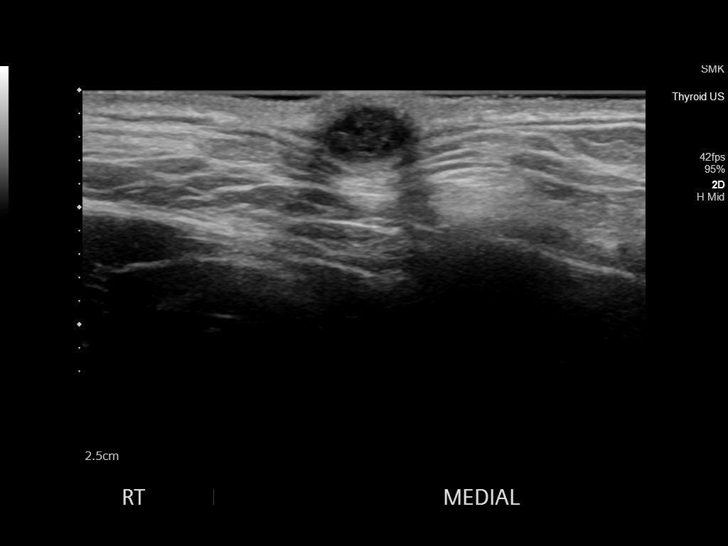
[im 19/22]
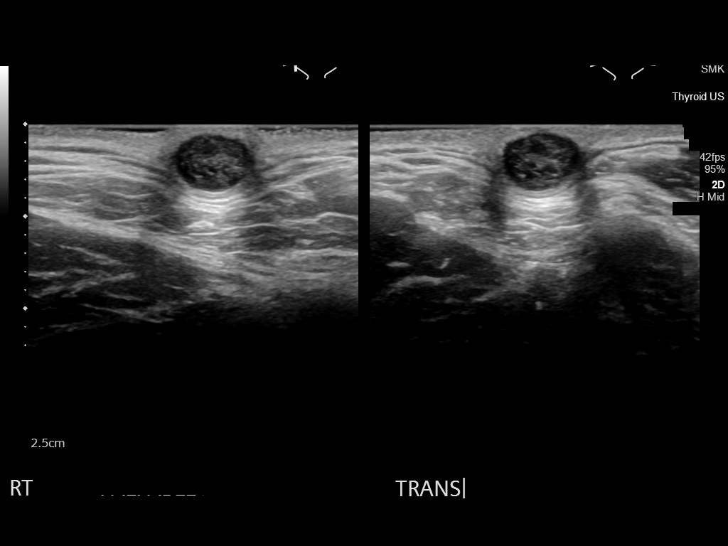
[im 20/22]
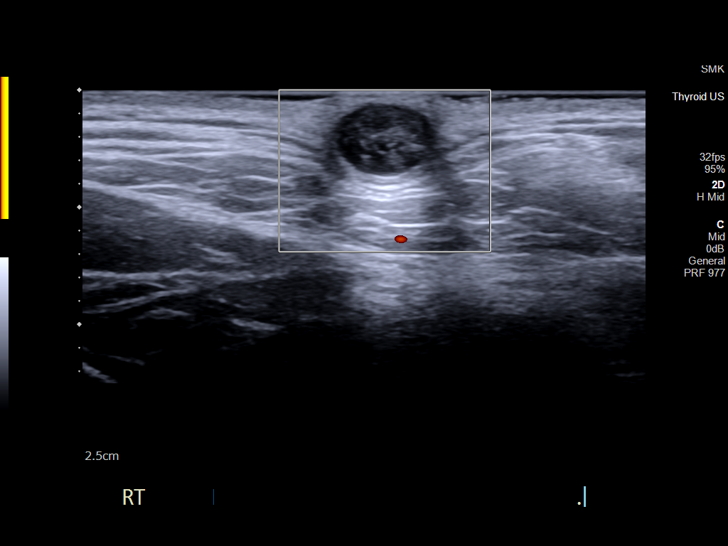
[im 22/22]
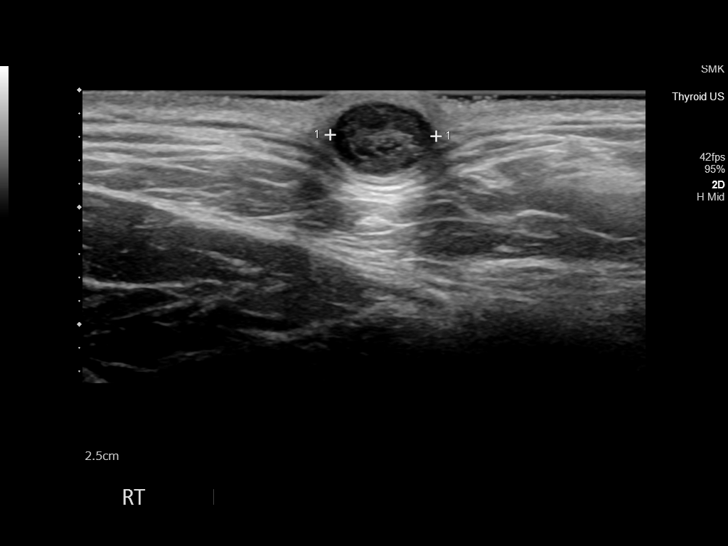

[14 of 22 positions shown; findings below may reference images not displayed]

FINDINGS: In the area of clinical concern in the right lower neck is a 9 x 6 x
9 mm nonvascular mass. The mass is complex/heterogeneously
hypoechoic with posterior acoustic enhancement and is centered in
the superficial subcutaneous tissues with extension towards the
skin.
IMPRESSION: 9 mm complex nonvascular mass in the right lower neck favored to
reflect an epidermal inclusion cyst or other similar pathology.

## 2021-06-01 ENCOUNTER — Other Ambulatory Visit: Payer: Self-pay

## 2021-06-01 ENCOUNTER — Encounter (HOSPITAL_COMMUNITY): Payer: Self-pay | Admitting: Emergency Medicine

## 2021-06-01 ENCOUNTER — Ambulatory Visit (HOSPITAL_COMMUNITY)
Admission: EM | Admit: 2021-06-01 | Discharge: 2021-06-01 | Disposition: A | Discharge status: Home / Self Care | Payer: BC Managed Care – PPO | Attending: Emergency Medicine | Admitting: Emergency Medicine

## 2021-06-01 DIAGNOSIS — R1013 Epigastric pain: Secondary | ICD-10-CM | POA: Diagnosis not present

## 2021-06-01 MED ORDER — FAMOTIDINE 40 MG PO TABS: 40.0000 mg | ORAL_TABLET | Freq: Every day | ORAL | 0 refills | Status: AC

## 2021-06-01 NOTE — ED Triage Notes (Signed)
Complains of abdominal pain that started 3 days ago.  Denies nausea or vomiting, denies diarrhea.  Patient says stomach feels like nervousness. Pain is in general, middle abdominal pain.

## 2021-06-01 NOTE — ED Provider Notes (Signed)
[facility]  ____________________________________________  Time seen: Approximately 10:26 AM  I have reviewed the triage vital signs and the nursing notes.   HISTORY  Chief Complaint Abdominal Pain   Assessment and Plan:  Patient     HPI Jonathan Salas is a 40 y.o. male presents to the urgent care with epigastric abdominal pain that started after patient had takeout.  Patient reports that he had some kind of sauce that he does not recall if it was acidic.  Denies a history of acid reflux to his knowledge.  He has had no nausea and vomiting and states that his pain is improved every day since symptoms started.  He denies chest pain, chest tightness or shortness of breath and reports that epigastric abdominal pain does not radiate to the back.  No right upper quadrant abdominal pain.  He denies rhinorrhea, nasal congestion or nonproductive cough.   History reviewed. No pertinent past medical history.   Immunizations up to date:  Yes.     History reviewed. No pertinent past medical history.  Patient Active Problem List   Diagnosis Date Noted   Callus of foot 02/09/2015    Past Surgical History:  Procedure Laterality Date   KNEE LIGAMENT RECONSTRUCTION Right     Prior to Admission medications   Medication Sig Start Date End Date Taking? Authorizing Provider  famotidine (PEPCID) 40 MG tablet Take 1 tablet (40 mg total) by mouth daily for 7 days. 06/01/21 06/08/21 Yes Pia Mau M, PA-C  doxycycline (VIBRAMYCIN) 100 MG capsule Take 1 capsule (100 mg total) by mouth 2 (two) times daily. Patient not taking: Reported on 06/01/2021 10/22/19   Elvina Sidle, MD    Allergies Patient has no known allergies.  Family History  Problem Relation Age of Onset   Healthy Mother    Healthy Father     Social History Social History   Tobacco Use   Smoking status: Never   Smokeless tobacco: Never  Vaping Use   Vaping Use: Never used  Substance Use Topics    Alcohol use: No   Drug use: No     Review of Systems  Constitutional: No fever/chills Eyes:  No discharge ENT: No upper respiratory complaints. Respiratory: no cough. No SOB/ use of accessory muscles to breath Gastrointestinal: Patient has epigastric abdominal pain.  Musculoskeletal: Negative for musculoskeletal pain. Skin: Negative for rash, abrasions, lacerations, ecchymosis.   ____________________________________________   PHYSICAL EXAM:  VITAL SIGNS: ED Triage Vitals  Enc Vitals Group     BP 06/01/21 0933 (!) 130/91     Pulse Rate 06/01/21 0933 86     Resp 06/01/21 0933 20     Temp 06/01/21 0933 98.2 F (36.8 C)     Temp Source 06/01/21 0933 Oral     SpO2 06/01/21 0933 97 %     Weight --      Height --      Head Circumference --      Peak Flow --      Pain Score 06/01/21 0930 3     Pain Loc --      Pain Edu? --      Excl. in GC? --      Constitutional: Alert and oriented. Well appearing and in no acute distress. Eyes: Conjunctivae are normal. PERRL. EOMI. Head: Atraumatic. ENT:  Cardiovascular: Normal rate, regular rhythm. Normal S1 and S2.  Good peripheral circulation. Respiratory: Normal respiratory effort without tachypnea or retractions. Lungs CTAB. Good air entry to the  bases with no decreased or absent breath sounds Gastrointestinal: Bowel sounds x 4 quadrants. Soft and nontender to palpation. No guarding or rigidity. No distention. Musculoskeletal: Full range of motion to all extremities. No obvious deformities noted Neurologic:  Normal for age. No gross focal neurologic deficits are appreciated.  Skin:  Skin is warm, dry and intact. No rash noted. Psychiatric: Mood and affect are normal for age. Speech and behavior are normal.   ____________________________________________   LABS (all labs ordered are listed, but only abnormal results are displayed)  Labs Reviewed - No data to  display ____________________________________________  EKG   ____________________________________________  RADIOLOGY   No results found.  ____________________________________________    PROCEDURES  Procedure(s) performed:     Procedures     Medications - No data to display   ____________________________________________   INITIAL IMPRESSION / ASSESSMENT AND PLAN / ED COURSE  Pertinent labs & imaging results that were available during my care of the patient were reviewed by me and considered in my medical decision making (see chart for details).      Assessment and plan Epigastric abdominal pain 40 year old male presents to the urgent care with epigastric abdominal pain for the past 3 days.  Vital signs are reassuring at triage.  On physical exam, patient was alert, active and nontoxic-appearing with new increased work of breathing.  Abdomen was soft and nontender to palpation.  Suspect acid reflux at this time.  We will start patient on Pepcid once daily.  Patient was cautioned that he should seek care at local emergency department if symptoms seem to be worsening at home.     ____________________________________________  FINAL CLINICAL IMPRESSION(S) / ED DIAGNOSES  Final diagnoses:  Epigastric pain      NEW MEDICATIONS STARTED DURING THIS VISIT:  ED Discharge Orders          Ordered    famotidine (PEPCID) 40 MG tablet  Daily        06/01/21 1010                This chart was dictated using voice recognition software/Dragon. Despite best efforts to proofread, errors can occur which can change the meaning. Any change was purely unintentional.     Orvil Feil, PA-C 06/01/21 1032

## 2021-06-01 NOTE — Discharge Instructions (Addendum)
Take 40 mg of Pepcid once daily. If symptoms seem to be worsening at home, please seek care in Emergency Department.

## 2022-07-07 ENCOUNTER — Encounter (HOSPITAL_COMMUNITY): Payer: Self-pay

## 2022-07-07 ENCOUNTER — Ambulatory Visit (HOSPITAL_COMMUNITY)
Admission: EM | Admit: 2022-07-07 | Discharge: 2022-07-07 | Disposition: A | Discharge status: Home / Self Care | Payer: BC Managed Care – PPO | Attending: Family Medicine | Admitting: Family Medicine

## 2022-07-07 DIAGNOSIS — J069 Acute upper respiratory infection, unspecified: Secondary | ICD-10-CM

## 2022-07-07 DIAGNOSIS — R030 Elevated blood-pressure reading, without diagnosis of hypertension: Secondary | ICD-10-CM

## 2022-07-07 DIAGNOSIS — J4521 Mild intermittent asthma with (acute) exacerbation: Secondary | ICD-10-CM | POA: Diagnosis not present

## 2022-07-07 DIAGNOSIS — R062 Wheezing: Secondary | ICD-10-CM

## 2022-07-07 MED ORDER — PREDNISONE 20 MG PO TABS: 40.0000 mg | ORAL_TABLET | Freq: Every day | ORAL | 0 refills | Status: DC

## 2022-07-07 MED ORDER — ALBUTEROL SULFATE HFA 108 (90 BASE) MCG/ACT IN AERS: 1.0000 | INHALATION_SPRAY | Freq: Four times a day (QID) | RESPIRATORY_TRACT | 1 refills | Status: AC | PRN

## 2022-07-07 MED ORDER — HYDROCODONE BIT-HOMATROP MBR 5-1.5 MG/5ML PO SOLN: 5.0000 mL | Freq: Four times a day (QID) | ORAL | 0 refills | Status: DC | PRN

## 2022-07-07 NOTE — ED Triage Notes (Signed)
  Pt presents to the office for cough,chest congestion x 2-3 days. Pt reports taking OTC medications.

## 2022-07-07 NOTE — Discharge Instructions (Addendum)
Your blood pressure was noted to be elevated during your visit today. If you are currently taking medication for high blood pressure, please ensure you are taking this as directed. If you do not have a history of high blood pressure and your blood pressure remains persistently elevated, you may need to begin taking a medication at some point. You may return here within the next few days to recheck if unable to see your primary care provider or if you do not have a one.  BP (!) 160/123 (BP Location: Left Arm)   Pulse 97   Temp 98.3 F (36.8 C) (Oral)   Resp 18   SpO2 100%   BP Readings from Last 3 Encounters:  07/07/22 (!) 160/123  06/01/21 (!) 130/91  10/22/19 138/85   Be aware, your cough medication may cause drowsiness. Please do not drive, operate heavy machinery or make important decisions while on this medication as it may cloud your judgement.

## 2022-07-09 NOTE — ED Provider Notes (Signed)
[facility]   [id number] 07/07/22 Arrival Time: 1515  ASSESSMENT & PLAN:  1. Viral URI with cough   2. Mild intermittent asthma with acute exacerbation   3. Wheezing   4. Elevated blood pressure reading without diagnosis of hypertension    Discussed typical duration of likely viral illness. Viral testing declined.. OTC symptom care as needed. No resp distress.  Discharge Medication List as of 07/07/2022  5:14 PM     START taking these medications   Details  albuterol (VENTOLIN HFA) 108 (90 Base) MCG/ACT inhaler Inhale 1-2 puffs into the lungs every 6 (six) hours as needed for wheezing or shortness of breath., Starting Sat 07/07/2022, Normal    HYDROcodone bit-homatropine (HYCODAN) 5-1.5 MG/5ML syrup Take 5 mLs by mouth every 6 (six) hours as needed for cough., Starting Sat 07/07/2022, Normal    predniSONE (DELTASONE) 20 MG tablet Take 2 tablets (40 mg total) by mouth daily., Starting Sat 07/07/2022, Normal         Discharge Instructions      Your blood pressure was noted to be elevated during your visit today. If you are currently taking medication for high blood pressure, please ensure you are taking this as directed. If you do not have a history of high blood pressure and your blood pressure remains persistently elevated, you may need to begin taking a medication at some point. You may return here within the next few days to recheck if unable to see your primary care provider or if you do not have a one.  BP (!) 160/123 (BP Location: Left Arm)   Pulse 97   Temp 98.3 F (36.8 C) (Oral)   Resp 18   SpO2 100%   BP Readings from Last 3 Encounters:  07/07/22 (!) 160/123  06/01/21 (!) 130/91  10/22/19 138/85   Be aware, your cough medication may cause drowsiness. Please do not drive, operate heavy machinery or make important decisions while on this medication as it may cloud your judgement.       Follow-up Information     Schedule an appointment as  soon as possible for a visit  with [facility].   Specialty: General Practice Why: To recheck your blood pressure. Contact information: [address] [place] [address] [phone]                 Reviewed expectations re: course of current medical issues. Questions answered. Outlined signs and symptoms indicating need for more acute intervention. Understanding verbalized. After Visit Summary given.   SUBJECTIVE: History from: Patient. Jonathan Salas is a 41 y.o. male. Reports: cough, chest congestion; x 2-3 days. Is fatigued. Denies: fever. Is wheezing with h/o asthma. Normal PO intake without n/v/d. Increased blood pressure noted today. Reports that he is not treated for HTN. He reports no chest pain on exertion, no swelling of ankles, no orthostatic dizziness or lightheadedness, no orthopnea or paroxysmal nocturnal dyspnea, and no palpitations.  OBJECTIVE:  Vitals:   07/07/22 1700  BP: (!) 160/123  Pulse: 97  Resp: 18  Temp: 98.3 F (36.8 C)  TempSrc: Oral  SpO2: 100%    General appearance: alert; no distress Eyes: PERRLA; EOMI; conjunctiva normal HENT: [place]; AT; with nasal congestion Neck: supple  Lungs: speaks full sentences without difficulty; unlabored; bilateral exp wheezing Extremities: no edema Skin: warm and dry Neurologic: normal gait Psychological: alert and cooperative; normal mood and affect  Labs: Results for orders placed or performed in visit  on 10/07/17  Semen Analysis, Basic  Result Value Ref Range   Time Collected 0930    Time Received 1000    Time Since Last Emission 48 hours   Appearance Normal    Liquefaction Normal    Viscosity Comment    Volume 1.4 (L) >1.4 mL   pH 8.5 >7.1   Epithelial Cells None seen /hpf   RBC NOSEE /hpf   Bacteria None seen /hpf   Trichomonas None seen /hpf   Debris None seen /hpf   Round Cell Concentration 0.7 x10E6/mL   Non-specific Aggregation None seen     Agglutination None seen    Concentration, Sperm 65.3 >14.9 x10E6/mL   Total Sperm in Ejaculate 91.4 >38.9 x10E6   Total Motility (PR+NP) 49 >39 %   Progressive Motility (PR) 40 >31 %   Non-Progressive (NP) 9 %   Immotile Sperm 51 %   Total Motile Sperm 45.2 x10E6   Progressively Motile Sperm 36.6 x10E6   NonProgressive Sperm 8.2 x10E6   Normal Morphology-Strict 38 >3 %   Leukocyte Concentration 0.00 <1.0 x10E6/mL   Immature Germ Cell Conc. 0.70 x10E6/mL   Labs Reviewed - No data to display  Imaging: No results found.  No Known Allergies  History reviewed. No pertinent past medical history. Social History   Socioeconomic History   Marital status: Significant Other    Spouse name: Not on file   Number of children: 1   Years of education: Not on file   Highest education level: Not on file  Occupational History   Occupation: Location manager  Tobacco Use   Smoking status: Never   Smokeless tobacco: Never  Vaping Use   Vaping Use: Never used  Substance and Sexual Activity   Alcohol use: No   Drug use: No   Sexual activity: Not on file  Other Topics Concern   Not on file  Social History Narrative   Not on file   Social Determinants of Health   Financial Resource Strain: Not on file  Food Insecurity: Not on file  Transportation Needs: Not on file  Physical Activity: Not on file  Stress: Not on file  Social Connections: Not on file  Intimate Partner Violence: Not on file   Family History  Problem Relation Age of Onset   Healthy Mother    Healthy Father    Past Surgical History:  Procedure Laterality Date   KNEE LIGAMENT RECONSTRUCTION Right      Mardella Layman, MD 07/09/22 1146

## 2023-07-12 NOTE — Progress Notes (Signed)
Sleep Medicine   Office Visit  Patient Name: Jonathan Salas DOB: 21-Jun-1981 MRN [id number]    Chief Complaint: discuss sleep study and next steps  Brief History:  Laudie presents for an initial sleep evaluation and to discuss recent sleep study results.  Patient reports his sleep quality is fair. This is noted most nights. The patient's bed partner reports  snoring, gasping, and witnessed apneic pauses at night. The patient relates the following symptoms: brain fogginess and trouble concentrating are also present. The patient goes to sleep at 1030 pm and wakes up at 0600 am and will occasionally wake up in between with occasional difficulty returning to sleep. Sleep quality is the same when outside home environment.  Patient has noted some movement of his legs at night.  The patient  relates some sleep talking as unusual sleep behavior during the night.  The patient no history of psychiatric problems. The Epworth Sleepiness Score is 0 out of 24 .  The patient relates  Cardiovascular risk factors include: none.    ROS  General: (-) fever, (-) chills, (-) night sweat Nose and Sinuses: (-) nasal stuffiness or itchiness, (-) postnasal drip, (-) nosebleeds, (-) sinus trouble. Mouth and Throat: (-) sore throat, (-) hoarseness. Neck: (-) swollen glands, (-) enlarged thyroid, (-) neck pain. Respiratory: - cough, - shortness of breath, - wheezing. Neurologic: - numbness, - tingling. Psychiatric: - anxiety, - depression Sleep behavior: -sleep paralysis -hypnogogic hallucinations -dream enactment      -vivid dreams -cataplexy -night terrors -sleep walking   Current Medication: Outpatient Encounter Medications as of 07/15/2023  Medication Sig   albuterol (VENTOLIN HFA) 108 (90 Base) MCG/ACT inhaler Inhale 1-2 puffs into the lungs every 6 (six) hours as needed for wheezing or shortness of breath.   famotidine (PEPCID) 40 MG tablet Take 1 tablet (40 mg total) by mouth daily for 7 days.    [DISCONTINUED] HYDROcodone bit-homatropine (HYCODAN) 5-1.5 MG/5ML syrup Take 5 mLs by mouth every 6 (six) hours as needed for cough.   [DISCONTINUED] predniSONE (DELTASONE) 20 MG tablet Take 2 tablets (40 mg total) by mouth daily.   No facility-administered encounter medications on file as of 07/15/2023.    Surgical History: Past Surgical History:  Procedure Laterality Date   KNEE LIGAMENT RECONSTRUCTION Right     Medical History: Past Medical History:  Diagnosis Date   OSA (obstructive sleep apnea) 07/15/2023    Family History: Non contributory to the present illness  Social History: Social History   Socioeconomic History   Marital status: Significant Other    Spouse name: Not on file   Number of children: 1   Years of education: Not on file   Highest education level: Not on file  Occupational History   Occupation: Location manager  Tobacco Use   Smoking status: Never   Smokeless tobacco: Never  Vaping Use   Vaping status: Never Used  Substance and Sexual Activity   Alcohol use: No   Drug use: No   Sexual activity: Not on file  Other Topics Concern   Not on file  Social History Narrative   Not on file   Social Drivers of Health   Financial Resource Strain: Not on file  Food Insecurity: Not on file  Transportation Needs: Not on file  Physical Activity: Not on file  Stress: Not on file  Social Connections: Not on file  Intimate Partner Violence: Not on file    Vital Signs: Blood pressure (!) 145/98, pulse 81, resp. rate 18,  height 6\' 1"  (1.854 m), weight (!) 322 lb (146.1 kg), SpO2 99%. Body mass index is 42.48 kg/m.   Examination: General Appearance: The patient is well-developed, well-nourished, and in no distress. Neck Circumference: 48cm Skin: Gross inspection of skin unremarkable. Head: normocephalic, no gross deformities. Eyes: no gross deformities noted. ENT: ears appear grossly normal Neurologic: Alert and oriented. No involuntary  movements.    STOP BANG RISK ASSESSMENT S (snore) Have you been told that you snore?     YES   T (tired) Are you often tired, fatigued, or sleepy during the day?   NO  O (obstruction) Do you stop breathing, choke, or gasp during sleep? YES   P (pressure) Do you have or are you being treated for high blood pressure? NO   B (BMI) Is your body index greater than 35 kg/m? YES   A (age) Are you 44 years old or older? NO   N (neck) Do you have a neck circumference greater than 16 inches?   YES   G (gender) Are you a male? YES   TOTAL STOP/BANG "YES" ANSWERS 5                                                               A STOP-Bang score of 2 or less is considered low risk, and a score of 5 or more is high risk for having either moderate or severe OSA. For people who score 3 or 4, doctors may need to perform further assessment to determine how likely they are to have OSA.         EPWORTH SLEEPINESS SCALE:  Scale:  (0)= no chance of dozing; (1)= slight chance of dozing; (2)= moderate chance of dozing; (3)= high chance of dozing  Chance  Situtation    Sitting and reading: 0    Watching TV: 0    Sitting Inactive in public: 0    As a passenger in car: 0      Lying down to rest: 0    Sitting and talking: 0    Sitting quielty after lunch: 0    In a car, stopped in traffic: 0   TOTAL SCORE:   0 out of 24    SLEEP STUDIES:  . HST 03/15/2023  AHI 7.7, Min SP02 80%   LABS: No results found for this or any previous visit (from the past 2160 hours).  Radiology: No results found.  No results found.  No results found.    Assessment and Plan: Patient Active Problem List   Diagnosis Date Noted   OSA (obstructive sleep apnea) 07/15/2023   Morbid obesity (HCC) 07/15/2023   Callus of foot 02/09/2015   1. OSA (obstructive sleep apnea) (Primary) PLAN OSA:   Patient evaluation suggests high risk of sleep disordered breathing due to AHI of 7.7 on HST from 02/2022.  Pt continues to have ongoing brain fog, trouble concentrating, snoring, gasping, and observed apnea. Recommend cpap titration and then set up with cpap. F/u after setup.    2. Morbid obesity (HCC) Obesity Counseling: Had a lengthy discussion regarding patients BMI and weight issues. Patient was instructed on portion control as well as increased activity. Also discussed caloric restrictions with trying to maintain intake less than 2000 Kcal. Discussions were made in  accordance with the 5As of weight management. Simple actions such as not eating late and if able to, taking a walk is suggested.    General Counseling: I have discussed the findings of the evaluation and examination with Derico.  I have also discussed any further diagnostic evaluation thatmay be needed or ordered today. Calden verbalizes understanding of the findings of todays visit. We also reviewed his medications today and discussed drug interactions and side effects including but not limited excessive drowsiness and altered mental states. We also discussed that there is always a risk not just to him but also people around him. he has been encouraged to call the office with any questions or concerns that should arise related to todays visit.  No orders of the defined types were placed in this encounter.       I have personally obtained a history, evaluated the patient, evaluated pertinent data, formulated the assessment and plan and placed orders.   This patient was seen today by Emmaline Kluver, PA-C in collaboration with Dr. Freda Munro.   Yevonne Pax, MD [facility] Diplomate ABMS Pulmonary and Critical Care Medicine Sleep medicine

## 2023-07-15 ENCOUNTER — Ambulatory Visit (INDEPENDENT_AMBULATORY_CARE_PROVIDER_SITE_OTHER): Payer: BC Managed Care – PPO | Admitting: Internal Medicine

## 2023-07-15 VITALS — BP 145/98 | HR 81 | Resp 18 | Ht 73.0 in | Wt 322.0 lb

## 2023-07-15 DIAGNOSIS — G4733 Obstructive sleep apnea (adult) (pediatric): Secondary | ICD-10-CM

## 2023-07-15 HISTORY — DX: Obstructive sleep apnea (adult) (pediatric): G47.33

## 2023-07-15 NOTE — Patient Instructions (Signed)
Living With Sleep Apnea Sleep apnea is a condition in which breathing pauses or becomes shallow during sleep. Sleep apnea is most commonly caused by a collapsed or blocked airway. People with sleep apnea usually snore loudly. They may have times when they gasp and stop breathing for 10 seconds or more during sleep. This may happen many times during the night. The breaks in breathing also interrupt the deep sleep that you need to feel rested. Even if you do not completely wake up from the gaps in breathing, your sleep may not be restful and you feel tired during the day. You may also have a headache in the morning and low energy during the day, and you may feel anxious or depressed. How can sleep apnea affect me? Sleep apnea increases your chances of extreme tiredness during the day (daytime fatigue). It can also increase your risk for health conditions, such as: Heart attack. Stroke. Obesity. Type 2 diabetes. Heart failure. Irregular heartbeat. High blood pressure. If you have daytime fatigue as a result of sleep apnea, you may be more likely to: Perform poorly at school or work. Fall asleep while driving. Have difficulty with attention. Develop depression or anxiety. Have sexual dysfunction. What actions can I take to manage sleep apnea? Sleep apnea treatment  If you were given a device to open your airway while you sleep, use it only as told by your health care provider. You may be given: An oral appliance. This is a custom-made mouthpiece that shifts your lower jaw forward. A continuous positive airway pressure (CPAP) device. This device blows air through a mask when you breathe out (exhale). A nasal expiratory positive airway pressure (EPAP) device. This device has valves that you put into each nostril. A bi-level positive airway pressure (BIPAP) device. This device blows air through a mask when you breathe in (inhale) and breathe out (exhale). You may need surgery if other treatments  do not work for you. Sleep habits Go to sleep and wake up at the same time every day. This helps set your internal clock (circadian rhythm) for sleeping. If you stay up later than usual, such as on weekends, try to get up in the morning within 2 hours of your normal wake time. Try to get at least 7-9 hours of sleep each night. Stop using a computer, tablet, and mobile phone a few hours before bedtime. Do not take long naps during the day. If you nap, limit it to 30 minutes. Have a relaxing bedtime routine. Reading or listening to music may relax you and help you sleep. Use your bedroom only for sleep. Keep your television and computer out of your bedroom. Keep your bedroom cool, dark, and quiet. Use a supportive mattress and pillows. Follow your health care provider's instructions for other changes to sleep habits. Nutrition Do not eat heavy meals in the evening. Do not have caffeine in the later part of the day. The effects of caffeine can last for more than 5 hours. Follow your health care provider's or dietitian's instructions for any diet changes. Lifestyle     Do not drink alcohol before bedtime. Alcohol can cause you to fall asleep at first, but then it can cause you to wake up in the middle of the night and have trouble getting back to sleep. Do not use any products that contain nicotine or tobacco. These products include cigarettes, chewing tobacco, and vaping devices, such as e-cigarettes. If you need help quitting, ask your health care provider. Medicines Take   over-the-counter and prescription medicines only as told by your health care provider. Do not use over-the-counter sleep medicine. You can become dependent on this medicine, and it can make sleep apnea worse. Do not use medicines, such as sedatives and narcotics, unless told by your health care provider. Activity Exercise on most days, but avoid exercising in the evening. Exercising near bedtime can interfere with  sleeping. If possible, spend time outside every day. Natural light helps regulate your circadian rhythm. General information Lose weight if you need to, and maintain a healthy weight. Keep all follow-up visits. This is important. If you are having surgery, make sure to tell your health care provider that you have sleep apnea. You may need to bring your device with you. Where to find more information Learn more about sleep apnea and daytime fatigue from: American Sleep Association: sleepassociation.org National Sleep Foundation: sleepfoundation.org National Heart, Lung, and Blood Institute: nhlbi.nih.gov Summary Sleep apnea is a condition in which breathing pauses or becomes shallow during sleep. Sleep apnea can cause daytime fatigue and other serious health conditions. You may need to wear a device while sleeping to help keep your airway open. If you are having surgery, make sure to tell your health care provider that you have sleep apnea. You may need to bring your device with you. Making changes to sleep habits, diet, lifestyle, and activity can help you manage sleep apnea. This information is not intended to replace advice given to you by your health care provider. Make sure you discuss any questions you have with your health care provider. Document Revised: 02/15/2021 Document Reviewed: 06/17/2020 Elsevier Patient Education  2023 Elsevier Inc.  

## 2024-01-17 NOTE — Progress Notes (Signed)
 [facility] [address] [place], [place] [address]  Pulmonary Sleep Medicine   Office Visit Note  Patient Name: Jonathan Salas DOB: [birth year] MRN [id number]    Chief Complaint: Obstructive Sleep Apnea visit  Brief History:  Jonathan Salas is seen today for a follow up visit for CPAP@ 5 cmH2O. The patient has a 10 month history of sleep apnea. Patient is using PAP nightly.  The patient feels rested after sleeping with PAP.  The patient reports benefiting from PAP use. Reported sleepiness is  improved and the Epworth Sleepiness Score is 0 out of 24. The patient does not take naps. The patient complains of the following: none.  The compliance download shows 72% compliance with an average use time of 7 hours 29 minutes. The AHI is 1.7.  The patient does not complain of limb movements disrupting sleep. The patient continues to require PAP therapy in order to eliminate sleep apnea.   ROS  General: (-) fever, (-) chills, (-) night sweat Nose and Sinuses: (-) nasal stuffiness or itchiness, (-) postnasal drip, (-) nosebleeds, (-) sinus trouble. Mouth and Throat: (-) sore throat, (-) hoarseness. Neck: (-) swollen glands, (-) enlarged thyroid , (-) neck pain. Respiratory: - cough, - shortness of breath, - wheezing. Neurologic: - numbness, - tingling. Psychiatric: - anxiety, - depression   Current Medication: Outpatient Encounter Medications as of 01/20/2024  Medication Sig   albuterol  (VENTOLIN  HFA) 108 (90 Base) MCG/ACT inhaler Inhale 1-2 puffs into the lungs every 6 (six) hours as needed for wheezing or shortness of breath.   famotidine  (PEPCID ) 40 MG tablet Take 1 tablet (40 mg total) by mouth daily for 7 days.   No facility-administered encounter medications on file as of 01/20/2024.    Surgical History: Past Surgical History:  Procedure Laterality Date   KNEE LIGAMENT RECONSTRUCTION Right     Medical History: Past Medical History:  Diagnosis Date   OSA (obstructive  sleep apnea) 07/15/2023    Family History: Non contributory to the present illness  Social History: Social History   Socioeconomic History   Marital status: Significant Other    Spouse name: Not on file   Number of children: 1   Years of education: Not on file   Highest education level: Not on file  Occupational History   Occupation: Location manager  Tobacco Use   Smoking status: Never   Smokeless tobacco: Never  Vaping Use   Vaping status: Never Used  Substance and Sexual Activity   Alcohol use: No   Drug use: No   Sexual activity: Not on file  Other Topics Concern   Not on file  Social History Narrative   Not on file   Social Drivers of Health   Financial Resource Strain: Not on file  Food Insecurity: Not on file  Transportation Needs: Not on file  Physical Activity: Not on file  Stress: Not on file  Social Connections: Not on file  Intimate Partner Violence: Not on file    Vital Signs: Blood pressure (!) 155/88, pulse 94. There is no height or weight on file to calculate BMI.    Examination: General Appearance: The patient is well-developed, well-nourished, and in no distress. Neck Circumference: 48 cm Skin: Gross inspection of skin unremarkable. Head: normocephalic, no gross deformities. Eyes: no gross deformities noted. ENT: ears appear grossly normal Neurologic: Alert and oriented. No involuntary movements.  STOP BANG RISK ASSESSMENT S (snore) Have you been told that you snore?     NO  T (tired) Are you often tired, fatigued, or sleepy during the day?   NO  O (obstruction) Do you stop breathing, choke, or gasp during sleep? NO   P (pressure) Do you have or are you being treated for high blood pressure? NO   B (BMI) Is your body index greater than 35 kg/m? YES   A (age) Are you 39 years old or older? NO   N (neck) Do you have a neck circumference greater than 16 inches?   YES   G (gender) Are you a male? YES   TOTAL STOP/BANG "YES"  ANSWERS 3       A STOP-Bang score of 2 or less is considered low risk, and a score of 5 or more is high risk for having either moderate or severe OSA. For people who score 3 or 4, doctors may need to perform further assessment to determine how likely they are to have OSA.         EPWORTH SLEEPINESS SCALE:  Scale:  (0)= no chance of dozing; (1)= slight chance of dozing; (2)= moderate chance of dozing; (3)= high chance of dozing  Chance  Situtation    Sitting and reading: 0    Watching TV: 0    Sitting Inactive in public: 0    As a passenger in car: 0      Lying down to rest: 0    Sitting and talking: 0    Sitting quielty after lunch: 0    In a car, stopped in traffic: 0   TOTAL SCORE:   0 out of 24    SLEEP STUDIES:   HST (02/2023)  AHI 7.7, Min SP02 80%  Titration (10/2023) CPAP@ 5 cmH2O   CPAP COMPLIANCE DATA:  Date Range: 11/21/2023-01/16/2024  Average Daily Use: 7 hours 29 minutes  Median Use: 7 hours 36 minutes  Compliance for > 4 Hours: 72%  AHI: 1.7 respiratory events per hour  Days Used: 42/57 days  Mask Leak: 7.6  95th Percentile Pressure: 5         LABS: No results found for this or any previous visit (from the past 2160 hours).  Radiology: No results found.  No results found.  No results found.    Assessment and Plan: Patient Active Problem List   Diagnosis Date Noted   OSA (obstructive sleep apnea) 07/15/2023   Morbid obesity (HCC) 07/15/2023   Callus of foot 02/09/2015    1. OSA (obstructive sleep apnea) (Primary) The patient does tolerate PAP and reports  benefit from PAP use. The patient was reminded how to clean equipment and advised to replace supplies routinely. The patient was also counselled on weight loss. The compliance is fair. The AHI is 1.7.   OSA on cpap- controlled. Continue with compliance with pap. CPAP continues to be medically necessary to treat this patient's OSA. F/u one year.    2. CPAP use  counseling CPAP Counseling: had a lengthy discussion with the patient regarding the importance of PAP therapy in management of the sleep apnea. Patient appears to understand the risk factor reduction and also understands the risks associated with untreated sleep apnea. Patient will try to make a good faith effort to remain compliant with therapy. Also instructed the patient on proper cleaning of the device including the water must be changed daily if possible and use of distilled water is preferred. Patient understands that the machine should be regularly cleaned with appropriate recommended cleaning solutions that do not damage the PAP  machine for example given white vinegar and water rinses. Other methods such as ozone treatment may not be as good as these simple methods to achieve cleaning.   3. Morbid obesity (HCC) Obesity Counseling: Had a lengthy discussion regarding patients BMI and weight issues. Patient was instructed on portion control as well as increased activity. Also discussed caloric restrictions with trying to maintain intake less than 2000 Kcal. Discussions were made in accordance with the 5As of weight management. Simple actions such as not eating late and if able to, taking a walk is suggested.     General Counseling: I have discussed the findings of the evaluation and examination with Harshith.  I have also discussed any further diagnostic evaluation thatmay be needed or ordered today. Dandra verbalizes understanding of the findings of todays visit. We also reviewed his medications today and discussed drug interactions and side effects including but not limited excessive drowsiness and altered mental states. We also discussed that there is always a risk not just to him but also people around him. he has been encouraged to call the office with any questions or concerns that should arise related to todays visit.  No orders of the defined types were placed in this encounter.       I  have personally obtained a history, examined the patient, evaluated laboratory and imaging results, formulated the assessment and plan and placed orders. This patient was seen today by Lauraine Lay, PA-C in collaboration with Dr. Elfreda Bathe.   Elfreda DELENA Bathe, MD [facility] [place] Diplomate ABMS Pulmonary Critical Care Medicine and Sleep Medicine

## 2024-01-20 ENCOUNTER — Ambulatory Visit (INDEPENDENT_AMBULATORY_CARE_PROVIDER_SITE_OTHER): Admitting: Internal Medicine

## 2024-01-20 VITALS — BP 155/88 | HR 94

## 2024-01-20 DIAGNOSIS — Z7189 Other specified counseling: Secondary | ICD-10-CM

## 2024-01-20 DIAGNOSIS — G4733 Obstructive sleep apnea (adult) (pediatric): Secondary | ICD-10-CM

## 2024-01-20 NOTE — Patient Instructions (Signed)

## 2024-07-15 NOTE — Progress Notes (Signed)
 [facility] [address] [place], [place] [address]  Pulmonary Sleep Medicine   Office Visit Note  Patient Name: Jonathan Salas DOB: 30-Nov-1980 MRN [id number]    Chief Complaint: Obstructive Sleep Apnea visit  Brief History:  Jonathan Salas is seen today for a follow visit for CPAP@ 5 cmH2O. The patient has a 1.5 year history of sleep apnea. Patient is using PAP nightly.  The patient feels rested after sleeping with PAP.  The patient reports benefiting from PAP use. Reported sleepiness is  improved and the Epworth Sleepiness Score is 0 out of 24. The patient does not take naps. The patient complains of the following: none.  The compliance download shows 51% compliance with an average use time of 7 hours 25 minutes. The AHI is 1.7.  The patient does not complain of limb movements disrupting sleep. The patient continues to require PAP therapy in order to eliminate sleep apnea.   ROS  General: (-) fever, (-) chills, (-) night sweat Nose and Sinuses: (-) nasal stuffiness or itchiness, (-) postnasal drip, (-) nosebleeds, (-) sinus trouble. Mouth and Throat: (-) sore throat, (-) hoarseness. Neck: (-) swollen glands, (-) enlarged thyroid , (-) neck pain. Respiratory: - cough, - shortness of breath, - wheezing. Neurologic: - numbness, - tingling. Psychiatric: - anxiety, - depression   Current Medication: Outpatient Encounter Medications as of 07/20/2024  Medication Sig   albuterol  (VENTOLIN  HFA) 108 (90 Base) MCG/ACT inhaler Inhale 1-2 puffs into the lungs every 6 (six) hours as needed for wheezing or shortness of breath.   famotidine  (PEPCID ) 40 MG tablet Take 1 tablet (40 mg total) by mouth daily for 7 days.   No facility-administered encounter medications on file as of 07/20/2024.    Surgical History: Past Surgical History:  Procedure Laterality Date   KNEE LIGAMENT RECONSTRUCTION Right     Medical History: Past Medical History:  Diagnosis Date   OSA (obstructive sleep  apnea) 07/15/2023    Family History: Non contributory to the present illness  Social History: Social History   Socioeconomic History   Marital status: Significant Other    Spouse name: Not on file   Number of children: 1   Years of education: Not on file   Highest education level: Not on file  Occupational History   Occupation: Location Manager  Tobacco Use   Smoking status: Never   Smokeless tobacco: Never  Vaping Use   Vaping status: Never Used  Substance and Sexual Activity   Alcohol use: No   Drug use: No   Sexual activity: Not on file  Other Topics Concern   Not on file  Social History Narrative   Not on file   Social Drivers of Health   Tobacco Use: Low Risk (07/20/2024)   Patient History    Smoking Tobacco Use: Never    Smokeless Tobacco Use: Never    Passive Exposure: Not on file  Financial Resource Strain: Not on file  Food Insecurity: Not on file  Transportation Needs: Not on file  Physical Activity: Not on file  Stress: Not on file  Social Connections: Not on file  Intimate Partner Violence: Not on file  Depression (EYV7-0): Not on file  Alcohol Screen: Not on file  Housing: Not on file  Utilities: Not on file  Health Literacy: Not on file    Vital Signs: Blood pressure 133/87, pulse 83, resp. rate 16, height 6' 1 (1.854 m), weight (!) 317 lb (143.8 kg), SpO2 98%. Body mass index is 41.82  kg/m.    Examination: General Appearance: The patient is well-developed, well-nourished, and in no distress. Neck Circumference: 48 cm Skin: Gross inspection of skin unremarkable. Head: normocephalic, no gross deformities. Eyes: no gross deformities noted. ENT: ears appear grossly normal Neurologic: Alert and oriented. No involuntary movements.  STOP BANG RISK ASSESSMENT S (snore) Have you been told that you snore?     NO   T (tired) Are you often tired, fatigued, or sleepy during the day?   NO  O (obstruction) Do you stop breathing, choke, or gasp  during sleep? NO   P (pressure) Do you have or are you being treated for high blood pressure? NO   B (BMI) Is your body index greater than 35 kg/m? YES   A (age) Are you 88 years old or older? NO   N (neck) Do you have a neck circumference greater than 16 inches?   YES   G (gender) Are you a male? YES   TOTAL STOP/BANG YES ANSWERS 3       A STOP-Bang score of 2 or less is considered low risk, and a score of 5 or more is high risk for having either moderate or severe OSA. For people who score 3 or 4, doctors may need to perform further assessment to determine how likely they are to have OSA.         EPWORTH SLEEPINESS SCALE:  Scale:  (0)= no chance of dozing; (1)= slight chance of dozing; (2)= moderate chance of dozing; (3)= high chance of dozing  Chance  Situtation    Sitting and reading: 0    Watching TV: 0    Sitting Inactive in public: 0    As a passenger in car: 0      Lying down to rest: 0    Sitting and talking: 0    Sitting quielty after lunch: 0    In a car, stopped in traffic: 0   TOTAL SCORE:   0 out of 24    SLEEP STUDIES:  HST (02/2023) AHI 7.7, min SPO2 80% Titration (10/2023) CPAP@ 5 cmH2O   CPAP COMPLIANCE DATA:  Date Range: 01/15/2024-07/12/2024  Average Daily Use: 7 hours 25 minutes  Median Use: 7 hours 28 minutes  Compliance for > 4 Hours: 51%  AHI: 1.7 respiratory events per hour  Days Used: 91/180 days  Mask Leak: 7.4  95th Percentile Pressure: 5         LABS: No results found for this or any previous visit (from the past 2160 hours).  Radiology: No results found.  No results found.  No results found.    Assessment and Plan: Patient Active Problem List   Diagnosis Date Noted   CPAP use counseling 01/20/2024   OSA (obstructive sleep apnea) 07/15/2023   Morbid obesity (HCC) 07/15/2023   Callus of foot 02/09/2015   1. OSA (obstructive sleep apnea) (Primary)  The patient does tolerate PAP and reports   benefit from PAP use. The patient was reminded how to clean equipment and advised to replace supplies routinely. The patient was also counselled on weight loss. The compliance is poor, he was encouraged to use his therapy more consistently. The AHI is 1.7.   OSA on cpap- controlled. Increase  compliance with pap. CPAP continues to be medically necessary to treat this patient's OSA. F/u one year.   2. CPAP use counseling CPAP Counseling: had a lengthy discussion with the patient regarding the importance of PAP therapy in management of the  sleep apnea. Patient appears to understand the risk factor reduction and also understands the risks associated with untreated sleep apnea. Patient will try to make a good faith effort to remain compliant with therapy. Also instructed the patient on proper cleaning of the device including the water must be changed daily if possible and use of distilled water is preferred. Patient understands that the machine should be regularly cleaned with appropriate recommended cleaning solutions that do not damage the PAP machine for example given white vinegar and water rinses. Other methods such as ozone treatment may not be as good as these simple methods to achieve cleaning.   3. Morbid obesity (HCC) Obesity Counseling: Had a lengthy discussion regarding patients BMI and weight issues. Patient was instructed on portion control as well as increased activity. Also discussed caloric restrictions with trying to maintain intake less than 2000 Kcal. Discussions were made in accordance with the 5As of weight management. Simple actions such as not eating late and if able to, taking a walk is suggested.      General Counseling: I have discussed the findings of the evaluation and examination with Jonathan Salas.  I have also discussed any further diagnostic evaluation thatmay be needed or ordered today. Jonathan Salas verbalizes understanding of the findings of todays visit. We also reviewed his  medications today and discussed drug interactions and side effects including but not limited excessive drowsiness and altered mental states. We also discussed that there is always a risk not just to him but also people around him. he has been encouraged to call the office with any questions or concerns that should arise related to todays visit.  No orders of the defined types were placed in this encounter.       I have personally obtained a history, examined the patient, evaluated laboratory and imaging results, formulated the assessment and plan and placed orders. This patient was seen today by Lauraine Lay, PA-C in collaboration with Dr. Elfreda Bathe.   Elfreda DELENA Bathe, MD [facility] Diplomate ABMS Pulmonary Critical Care Medicine and Sleep Medicine

## 2024-07-20 ENCOUNTER — Ambulatory Visit: Admitting: Internal Medicine

## 2024-07-20 VITALS — BP 133/87 | HR 83 | Resp 16 | Ht 73.0 in | Wt 317.0 lb

## 2024-07-20 DIAGNOSIS — Z7189 Other specified counseling: Secondary | ICD-10-CM

## 2024-07-20 DIAGNOSIS — G4733 Obstructive sleep apnea (adult) (pediatric): Secondary | ICD-10-CM | POA: Diagnosis not present

## 2024-07-20 NOTE — Patient Instructions (Signed)
